# Patient Record
Sex: Male | Born: 1950 | Race: White | Hispanic: No | Marital: Married | Smoking: Never smoker
Health system: Southern US, Community
[De-identification: ages and names within clinical notes are randomized; demographics above are authoritative.]

## PROBLEM LIST (undated history)

## (undated) DIAGNOSIS — I10 Essential (primary) hypertension: Secondary | ICD-10-CM

## (undated) DIAGNOSIS — E119 Type 2 diabetes mellitus without complications: Secondary | ICD-10-CM

## (undated) DIAGNOSIS — E785 Hyperlipidemia, unspecified: Secondary | ICD-10-CM

## (undated) HISTORY — DX: Hyperlipidemia, unspecified: E78.5

## (undated) HISTORY — DX: Essential (primary) hypertension: I10

## (undated) HISTORY — PX: CORONARY ANGIOPLASTY: SHX604

## (undated) HISTORY — DX: Type 2 diabetes mellitus without complications: E11.9

---

## 2015-07-19 DIAGNOSIS — I6782 Cerebral ischemia: Secondary | ICD-10-CM

## 2015-07-19 DIAGNOSIS — I469 Cardiac arrest, cause unspecified: Secondary | ICD-10-CM | POA: Insufficient documentation

## 2015-07-19 DIAGNOSIS — I213 ST elevation (STEMI) myocardial infarction of unspecified site: Secondary | ICD-10-CM | POA: Insufficient documentation

## 2015-07-19 DIAGNOSIS — G931 Anoxic brain damage, not elsewhere classified: Secondary | ICD-10-CM | POA: Insufficient documentation

## 2015-07-23 DIAGNOSIS — J189 Pneumonia, unspecified organism: Secondary | ICD-10-CM | POA: Insufficient documentation

## 2015-07-23 DIAGNOSIS — I10 Essential (primary) hypertension: Secondary | ICD-10-CM | POA: Insufficient documentation

## 2015-07-23 DIAGNOSIS — E118 Type 2 diabetes mellitus with unspecified complications: Secondary | ICD-10-CM | POA: Insufficient documentation

## 2015-07-23 DIAGNOSIS — N179 Acute kidney failure, unspecified: Secondary | ICD-10-CM | POA: Insufficient documentation

## 2015-07-23 DIAGNOSIS — I5020 Unspecified systolic (congestive) heart failure: Secondary | ICD-10-CM | POA: Insufficient documentation

## 2015-07-24 DIAGNOSIS — R7881 Bacteremia: Secondary | ICD-10-CM | POA: Insufficient documentation

## 2015-07-26 DIAGNOSIS — I639 Cerebral infarction, unspecified: Secondary | ICD-10-CM | POA: Insufficient documentation

## 2015-07-27 DIAGNOSIS — I6322 Cerebral infarction due to unspecified occlusion or stenosis of basilar arteries: Secondary | ICD-10-CM

## 2015-07-27 DIAGNOSIS — I63212 Cerebral infarction due to unspecified occlusion or stenosis of left vertebral arteries: Secondary | ICD-10-CM | POA: Insufficient documentation

## 2015-07-27 DIAGNOSIS — I63442 Cerebral infarction due to embolism of left cerebellar artery: Secondary | ICD-10-CM | POA: Insufficient documentation

## 2015-08-19 DIAGNOSIS — I255 Ischemic cardiomyopathy: Secondary | ICD-10-CM | POA: Insufficient documentation

## 2015-08-19 DIAGNOSIS — I2510 Atherosclerotic heart disease of native coronary artery without angina pectoris: Secondary | ICD-10-CM | POA: Insufficient documentation

## 2015-08-19 DIAGNOSIS — I6990 Unspecified sequelae of unspecified cerebrovascular disease: Secondary | ICD-10-CM | POA: Insufficient documentation

## 2016-07-26 ENCOUNTER — Telehealth: Payer: Self-pay | Admitting: Cardiology

## 2016-07-26 ENCOUNTER — Other Ambulatory Visit: Payer: Self-pay

## 2016-07-26 DIAGNOSIS — I213 ST elevation (STEMI) myocardial infarction of unspecified site: Secondary | ICD-10-CM | POA: Insufficient documentation

## 2016-07-26 DIAGNOSIS — I2102 ST elevation (STEMI) myocardial infarction involving left anterior descending coronary artery: Secondary | ICD-10-CM | POA: Insufficient documentation

## 2016-07-26 MED ORDER — FUROSEMIDE 20 MG PO TABS: 20.0000 mg | ORAL_TABLET | Freq: Every day | ORAL | 3 refills | Status: DC

## 2016-07-26 NOTE — Telephone Encounter (Signed)
Refills sent to pharmacy. 

## 2016-07-26 NOTE — Telephone Encounter (Signed)
Call [facility]urosimide 20mg  [facility] [place]

## 2016-08-04 ENCOUNTER — Telehealth: Payer: Self-pay

## 2016-08-04 ENCOUNTER — Encounter: Payer: Self-pay | Admitting: Cardiology

## 2016-08-04 ENCOUNTER — Ambulatory Visit (INDEPENDENT_AMBULATORY_CARE_PROVIDER_SITE_OTHER): Payer: Medicare HMO | Admitting: Cardiology

## 2016-08-04 VITALS — BP 140/84 | HR 76 | Resp 12 | Ht 71.0 in | Wt 283.8 lb

## 2016-08-04 DIAGNOSIS — I10 Essential (primary) hypertension: Secondary | ICD-10-CM | POA: Diagnosis not present

## 2016-08-04 DIAGNOSIS — I255 Ischemic cardiomyopathy: Secondary | ICD-10-CM | POA: Diagnosis not present

## 2016-08-04 DIAGNOSIS — I6990 Unspecified sequelae of unspecified cerebrovascular disease: Secondary | ICD-10-CM | POA: Diagnosis not present

## 2016-08-04 DIAGNOSIS — I2510 Atherosclerotic heart disease of native coronary artery without angina pectoris: Secondary | ICD-10-CM

## 2016-08-04 NOTE — Telephone Encounter (Signed)
Attempt to reach patient to inform of appt for Lexiscan at [facility] 8/1 and 8/2 at Adam Carney code [phone] order faxed to [email or web address]

## 2016-08-04 NOTE — Telephone Encounter (Signed)
Attempted to call patient's number in chart. No answer, no voicemail. Called son listed on DPR. Appointment dates and times given to him and he will inform the patient.

## 2016-08-04 NOTE — Patient Instructions (Signed)
Medication Instructions:  Your physician recommends that you continue on your current medications as directed. Please refer to the Current Medication list given to you today.   Labwork: None  Testing/Procedures: Your physician has requested that you have a lexiscan myoview. For further information please visit [email or web address]. Please follow instruction sheet, as given.    Follow-Up: Your physician recommends that you schedule a follow-up appointment in: 1 month   Any Other Special Instructions Will Be Listed Below (If Applicable).     If you need a refill on your cardiac medications before your next appointment, please call your pharmacy.

## 2016-08-04 NOTE — Progress Notes (Signed)
Cardiology Office Note:    Date:  08/04/2016   ID:  Adam Carney, DOB 19-Jun-1950, MRN [id number]  PCP:  Martinique, Sarah T, MD  Cardiologist:  Jenne Campus, MD    Referring MD: Martinique, Sarah T, MD   Chief Complaint  Patient presents with  . Follow-up  I'm weak and tired  History of Present Illness:    Adam Carney is a 66 y.o. male  with coronary artery disease, he complained of the week.exhausted. He tries to exercise a little bit more aggressively but still tiredness and fatigue is better. On top of that his wife described the fact that he is getting very pale sometimes when he exercises. Denies having any chest pain tightness squeezing pressure burning chest but even when he had his myocardial infarction he did not have any symptoms.  Past Medical History:  Diagnosis Date  . Diabetes mellitus without complication ([place])   . Hyperlipidemia   . Hypertension     Past Surgical History:  Procedure Laterality Date  . CORONARY ANGIOPLASTY      Current Medications: Current Meds  Medication Sig  . apixaban (ELIQUIS) 5 MG TABS tablet Take 5 mg by mouth 2 (two) times daily.  Marland Kitchen atorvastatin (LIPITOR) 80 MG tablet Take 80 mg by mouth daily.  . carvedilol (COREG) 25 MG tablet Take 25 mg by mouth 2 (two) times daily.  . furosemide (LASIX) 20 MG tablet Take 1 tablet (20 mg total) by mouth daily.  Marland Kitchen glipiZIDE (GLUCOTROL) 5 MG tablet Take 5 mg by mouth 2 (two) times daily before a meal.   . Insulin Detemir (LEVEMIR FLEXPEN) 100 UNIT/ML Pen Inject 60 Units into the skin daily.   Marland Kitchen liraglutide (VICTOZA) 18 MG/3ML SOPN TAKE 0.6 MG (0.6ML) SUBQ DAILY FOR 1 WEEK, THEN INCREASE TO 1.2MG ( 1.2ML) SUB Q WEEKLY.  Marland Kitchen losartan (COZAAR) 50 MG tablet Take 50 mg by mouth.  Marland Kitchen omeprazole (PRILOSEC) 20 MG capsule Take 20 mg by mouth.  . [DISCONTINUED] furosemide (LASIX) 20 MG tablet Take 40 mg by mouth daily.  . [DISCONTINUED] ticagrelor (BRILINTA) 90 MG TABS tablet Take 90 mg by mouth 2 (two) times daily.     Allergies:   Patient has no known allergies.   Social History   Social History  . Marital status: Married    Spouse name: N/A  . Number of children: N/A  . Years of education: N/A   Social History Main Topics  . Smoking status: Never Smoker  . Smokeless tobacco: Never Used  . Alcohol use No  . Drug use: No  . Sexual activity: Not Asked   Other Topics Concern  . None   Social History Narrative  . None     Family History: The patient's family history includes AAA (abdominal aortic aneurysm) in his father; Diabetes in his mother; Hyperlipidemia in his mother; Hypertension in his mother. ROS:   Please see the history of present illness.    All 14 point review of systems negative except as described per history of present illness  EKGs/Labs/Other Studies Reviewed:      Recent Labs: No results found for requested labs within last 8760 hours.  Recent Lipid Panel No results found for: CHOL, TRIG, HDL, CHOLHDL, VLDL, LDLCALC, LDLDIRECT  Physical Exam:    VS:  BP 140/84   Pulse 76   Resp 12   Ht 5\' 11"  (1.803 m)   Wt 283 lb 12.8 oz (128.7 kg)   BMI 39.58 kg/m     Wt  Readings from Last 3 Encounters:  08/04/16 283 lb 12.8 oz (128.7 kg)     GEN:  Well nourished, well developed in no acute distress HEENT: Normal NECK: No JVD; No carotid bruits LYMPHATICS: No lymphadenopathy CARDIAC: RRR, no murmurs, no rubs, no gallops RESPIRATORY:  Clear to auscultation without rales, wheezing or rhonchi  ABDOMEN: Soft, non-tender, non-distended MUSCULOSKELETAL:  No edema; No deformity  SKIN: Warm and dry LOWER EXTREMITIES: no swelling NEUROLOGIC:  Alert and oriented x 3 PSYCHIATRIC:  Normal affect   ASSESSMENT:    1. Essential hypertension   2. Ischemic cardiomyopathy   3. Late effects of CVA (cerebrovascular accident)   4. Coronary artery disease involving native coronary artery of native heart without angina pectoris    PLAN:    In order of problems listed  above:  1. Essential hypertension: Blood pressure mildly elevated we'll call his primary care physician. Chem-7 C5 increased dose of losartan. 2. Ischemic heart myopathy: We'll do stress test which we'll give him some estimation of ejection fraction. In December we'll repeat echocardiogram. 3. Late effect of CVA: No new changes. 4. Coronary artery disease: He describes some atypical symptoms I will ask him to have a stress test done. She already finished one year course of Brilinta.   Medication Adjustments/Labs and Tests Ordered: Current medicines are reviewed at length with the patient today.  Concerns regarding medicines are outlined above.  No orders of the defined types were placed in this encounter.  Medication changes: No orders of the defined types were placed in this encounter.   Signed, Park Liter, MD, [facility] 08/04/2016 9:33 AM    [place]

## 2016-08-09 DIAGNOSIS — I2510 Atherosclerotic heart disease of native coronary artery without angina pectoris: Secondary | ICD-10-CM | POA: Diagnosis not present

## 2016-08-11 ENCOUNTER — Other Ambulatory Visit: Payer: Self-pay

## 2016-08-11 DIAGNOSIS — I5043 Acute on chronic combined systolic (congestive) and diastolic (congestive) heart failure: Secondary | ICD-10-CM

## 2016-08-11 DIAGNOSIS — I255 Ischemic cardiomyopathy: Secondary | ICD-10-CM

## 2016-08-11 MED ORDER — FUROSEMIDE 20 MG PO TABS: 20.0000 mg | ORAL_TABLET | Freq: Three times a day (TID) | ORAL | 3 refills | Status: DC

## 2016-08-11 MED ORDER — LOSARTAN POTASSIUM 50 MG PO TABS: 50.0000 mg | ORAL_TABLET | Freq: Every day | ORAL | 3 refills | Status: DC

## 2016-08-11 MED ORDER — ATORVASTATIN CALCIUM 80 MG PO TABS: 80.0000 mg | ORAL_TABLET | Freq: Every day | ORAL | 3 refills | Status: DC

## 2016-08-11 MED ORDER — CARVEDILOL 25 MG PO TABS: 25.0000 mg | ORAL_TABLET | Freq: Two times a day (BID) | ORAL | 3 refills | Status: DC

## 2016-08-11 MED ORDER — APIXABAN 5 MG PO TABS: 5.0000 mg | ORAL_TABLET | Freq: Two times a day (BID) | ORAL | 11 refills | Status: DC

## 2016-08-14 ENCOUNTER — Telehealth: Payer: Self-pay

## 2016-08-14 NOTE — Telephone Encounter (Signed)
Wife advised of date and time of the echo.

## 2016-08-14 NOTE — Telephone Encounter (Signed)
Please call wife about echo appt .cn

## 2016-08-15 ENCOUNTER — Other Ambulatory Visit: Payer: Self-pay

## 2016-08-15 MED ORDER — APIXABAN 5 MG PO TABS: 5.0000 mg | ORAL_TABLET | Freq: Two times a day (BID) | ORAL | 11 refills | Status: DC

## 2016-08-25 ENCOUNTER — Ambulatory Visit (HOSPITAL_BASED_OUTPATIENT_CLINIC_OR_DEPARTMENT_OTHER)
Admission: RE | Admit: 2016-08-25 | Discharge: 2016-08-25 | Disposition: A | Discharge status: Home / Self Care | Payer: Medicare HMO | Source: Ambulatory Visit | Attending: Cardiology | Admitting: Cardiology

## 2016-08-25 DIAGNOSIS — I255 Ischemic cardiomyopathy: Secondary | ICD-10-CM

## 2016-08-25 DIAGNOSIS — I051 Rheumatic mitral insufficiency: Secondary | ICD-10-CM | POA: Diagnosis not present

## 2016-08-25 NOTE — Progress Notes (Signed)
  Echocardiogram 2D Echocardiogram has been performed.  Adam Carney Adam Carney 08/25/2016, 10:11 AM

## 2016-09-01 ENCOUNTER — Ambulatory Visit (INDEPENDENT_AMBULATORY_CARE_PROVIDER_SITE_OTHER): Payer: Medicare HMO | Admitting: Cardiology

## 2016-09-01 VITALS — BP 114/70 | HR 68 | Resp 10 | Ht 71.0 in | Wt 284.8 lb

## 2016-09-01 DIAGNOSIS — I255 Ischemic cardiomyopathy: Secondary | ICD-10-CM

## 2016-09-01 DIAGNOSIS — I10 Essential (primary) hypertension: Secondary | ICD-10-CM | POA: Diagnosis not present

## 2016-09-01 DIAGNOSIS — I6990 Unspecified sequelae of unspecified cerebrovascular disease: Secondary | ICD-10-CM

## 2016-09-01 DIAGNOSIS — I5022 Chronic systolic (congestive) heart failure: Secondary | ICD-10-CM | POA: Diagnosis not present

## 2016-09-01 NOTE — Progress Notes (Signed)
Cardiology Office Note:    Date:  09/01/2016   ID:  Adam Carney, DOB Jan 06, 1951, MRN [id number]  PCP:  Martinique, Sarah T, MD  Cardiologist:  Jenne Campus, MD    Referring MD: Martinique, Sarah T, MD   Chief Complaint  Patient presents with  . 1 month follow up  I'm doing better  History of Present Illness:    Adam Carney is a 66 y.o. male  with ischemic cardiomyopathy is being complaining of being tired and exhausted having exertional shortness of breath. Stress test was done which showed no evidence of ischemia: Echocardiac exam was done which showed ejection fraction 40-45%. We spent cradle time talking about the scenario and I realized that he does not do anything all day. I strongly advised him to start walking every single day. Interestingly when he went to have his echocardiogram done in [place] just walking from the car to the cardiovascular department created such a pain in his legs that Today he does have some soreness of his muscles which basically tells me that he does not do much. Again, advised him to exercise on the radial basis he most likely have sleep apnea he was talking to his primary care physician about potentially being tested for it but since he is claustrophobic he said he is not on his equipment that's when he is not interested in being tested. I told him however if he started exercising he will lose some weight which will help with potential sleep apnea.  Past Medical History:  Diagnosis Date  . Diabetes mellitus without complication ([place])   . Hyperlipidemia   . Hypertension     Past Surgical History:  Procedure Laterality Date  . CORONARY ANGIOPLASTY      Current Medications: Current Meds  Medication Sig  . apixaban (ELIQUIS) 5 MG TABS tablet Take 1 tablet (5 mg total) by mouth 2 (two) times daily.  Marland Kitchen atorvastatin (LIPITOR) 80 MG tablet Take 1 tablet (80 mg total) by mouth daily.  . carvedilol (COREG) 25 MG tablet Take 1 tablet (25 mg total) by mouth 2  (two) times daily.  . furosemide (LASIX) 20 MG tablet Take 1 tablet (20 mg total) by mouth 3 (three) times daily.  Marland Kitchen glipiZIDE (GLUCOTROL) 5 MG tablet Take 5 mg by mouth 2 (two) times daily before a meal.   . Insulin Detemir (LEVEMIR FLEXPEN) 100 UNIT/ML Pen Inject 60 Units into the skin daily.   Marland Kitchen liraglutide (VICTOZA) 18 MG/3ML SOPN TAKE 0.6 MG (0.6ML) SUBQ DAILY FOR 1 WEEK, THEN INCREASE TO 1.2MG ( 1.2ML) SUB Q WEEKLY.  Marland Kitchen losartan (COZAAR) 50 MG tablet Take 1 tablet (50 mg total) by mouth daily.  Marland Kitchen omeprazole (PRILOSEC) 20 MG capsule Take 20 mg by mouth.     Allergies:   Patient has no known allergies.   Social History   Social History  . Marital status: Married    Spouse name: N/A  . Number of children: N/A  . Years of education: N/A   Social History Main Topics  . Smoking status: Never Smoker  . Smokeless tobacco: Never Used  . Alcohol use No  . Drug use: No  . Sexual activity: Not on file   Other Topics Concern  . Not on file   Social History Narrative  . No narrative on file     Family History: The patient's family history includes AAA (abdominal aortic aneurysm) in his father; Diabetes in his mother; Hyperlipidemia in his mother; Hypertension in his mother. ROS:  Please see the history of present illness.    All 14 point review of systems negative except as described per history of present illness  EKGs/Labs/Other Studies Reviewed:      Recent Labs: No results found for requested labs within last 8760 hours.  Recent Lipid Panel No results found for: CHOL, TRIG, HDL, CHOLHDL, VLDL, LDLCALC, LDLDIRECT  Physical Exam:    VS:  BP 114/70   Pulse 68   Resp 10   Ht 5\' 11"  (1.803 m)   Wt 284 lb 12.8 oz (129.2 kg)   BMI 39.72 kg/m     Wt Readings from Last 3 Encounters:  09/01/16 284 lb 12.8 oz (129.2 kg)  08/04/16 283 lb 12.8 oz (128.7 kg)     GEN:  Well nourished, well developed in no acute distress HEENT: Normal NECK: No JVD; No carotid  bruits LYMPHATICS: No lymphadenopathy CARDIAC: RRR, no murmurs, no rubs, no gallops RESPIRATORY:  Clear to auscultation without rales, wheezing or rhonchi  ABDOMEN: Soft, non-tender, non-distended MUSCULOSKELETAL:  No edema; No deformity  SKIN: Warm and dry LOWER EXTREMITIES: no swelling NEUROLOGIC:  Alert and oriented x 3 PSYCHIATRIC:  Normal affect   ASSESSMENT:    1. Essential hypertension   2. Ischemic cardiomyopathy   3. Chronic systolic congestive heart failure ([place])   4. Late effects of CVA (cerebrovascular accident)    PLAN:    In order of problems listed above:  1. Essential hypertension: Blood pressure well-controlled continue present management. 2. Ischemic coronary myopathy on appropriate medications which I will continue. 3. Chronic systolic congestive-stable continue present management. 4. Late effect of CVA stable.  I will see him back in my office in about 3 months assuming his problem   Medication Adjustments/Labs and Tests Ordered: Current medicines are reviewed at length with the patient today.  Concerns regarding medicines are outlined above.  No orders of the defined types were placed in this encounter.  Medication changes: No orders of the defined types were placed in this encounter.   Signed, Park Liter, MD, [facility] 09/01/2016 9:03 AM    [place]

## 2016-09-01 NOTE — Patient Instructions (Addendum)
Medication Instructions:  Your physician recommends that you continue on your current medications as directed. Please refer to the Current Medication list given to you today.   Labwork: None  Testing/Procedures: None  Follow-Up: Your physician recommends that you schedule a follow-up appointment in: 3 months  Any Other Special Instructions Will Be Listed Below (If Applicable).     If you need a refill on your cardiac medications before your next appointment, please call your pharmacy.   

## 2016-09-13 ENCOUNTER — Telehealth: Payer: Self-pay

## 2016-09-13 NOTE — Telephone Encounter (Signed)
S/w pts wife and advised that I s/w [employer] rep regarding the status of the patient assistance form and they noted they are behind from April. I have double checked and corrected any problems with the application to ensure the process can be done as quick as possible per the rep.

## 2016-11-27 ENCOUNTER — Telehealth: Payer: Self-pay | Admitting: Cardiology

## 2016-11-27 ENCOUNTER — Other Ambulatory Visit: Payer: Self-pay

## 2016-11-27 MED ORDER — APIXABAN 5 MG PO TABS: 5.0000 mg | ORAL_TABLET | Freq: Two times a day (BID) | ORAL | 3 refills | Status: DC

## 2016-11-27 NOTE — Telephone Encounter (Signed)
Please call patient about her application for Eliquis and they still have not heard or received any medicine. Please call her.

## 2016-11-27 NOTE — Telephone Encounter (Signed)
S/w pt assistance with Eliquis. They are still stating they are missing the proper documents after this has been addressed multiple times in October. A new RX was printed and forms were resent. The representee on the phone states they had no alternative fax number to send request. Wife was notified of this as well and I have arranged a sooner f/u for the pt so that we can provide samples at his planned follow up.

## 2016-12-05 ENCOUNTER — Ambulatory Visit: Payer: Medicare HMO | Admitting: Cardiology

## 2016-12-05 ENCOUNTER — Encounter: Payer: Self-pay | Admitting: Cardiology

## 2016-12-05 VITALS — BP 108/64 | HR 64 | Resp 12 | Ht 71.0 in | Wt 288.8 lb

## 2016-12-05 DIAGNOSIS — I5022 Chronic systolic (congestive) heart failure: Secondary | ICD-10-CM

## 2016-12-05 DIAGNOSIS — I255 Ischemic cardiomyopathy: Secondary | ICD-10-CM

## 2016-12-05 DIAGNOSIS — I2510 Atherosclerotic heart disease of native coronary artery without angina pectoris: Secondary | ICD-10-CM

## 2016-12-05 DIAGNOSIS — I10 Essential (primary) hypertension: Secondary | ICD-10-CM

## 2016-12-05 NOTE — Patient Instructions (Addendum)
Medication Instructions:  Your physician recommends that you continue on your current medications as directed. Please refer to the Current Medication list given to you today.  Labwork: None   Testing/Procedures: Your physician has requested that you have an ankle brachial index (ABI). During this test an ultrasound and blood pressure cuff are used to evaluate the arteries that supply the arms and legs with blood. Allow thirty minutes for this exam. There are no restrictions or special instructions.   Follow-Up: Your physician recommends that you schedule a follow-up appointment in: 4 months   Any Other Special Instructions Will Be Listed Below (If Applicable).  Please note that any paperwork needing to be filled out by the provider will need to be addressed at the front desk prior to seeing the provider. Please note that any paperwork FMLA, Disability or other documents regarding health condition is subject to a $25.00 charge that must be received prior to completion of paperwork in the form of a money order or check.    If you need a refill on your cardiac medications before your next appointment, please call your pharmacy.

## 2016-12-05 NOTE — Progress Notes (Signed)
Cardiology Office Note:    Date:  12/05/2016   ID:  Adam Carney, DOB [birth year], MRN [id number]  PCP:  Martinique, Sarah T, MD  Cardiologist:  Adam Campus, MD    Referring MD: Martinique, Sarah T, MD   Chief Complaint  Patient presents with  . Follow-up  Doing well  History of Present Illness:    Adam Carney is a 66 y.o. male with cardiomyopathy which is ischemic in origin ejection fraction 45%.  Also diabetes and hypertension.  He comes today to our office for follow-up.  Overall he is doing well.  Denies having any chest pain tightness squeezing pressure burning chest.  I was trying to convince him to be a little more active and he is trying to do that.  Still complained of having leg pain that happens especially when he walks.  Initial thinking was that he simply deconditioned but now since he walk a little bit more still develop pain in his thighs as well as in his calves.  Suggesting of vascular disease.  Past Medical History:  Diagnosis Date  . Diabetes mellitus without complication ([place])   . Hyperlipidemia   . Hypertension     Past Surgical History:  Procedure Laterality Date  . CORONARY ANGIOPLASTY      Current Medications: Current Meds  Medication Sig  . apixaban (ELIQUIS) 5 MG TABS tablet Take 1 tablet (5 mg total) 2 (two) times daily by mouth.  Marland Kitchen atorvastatin (LIPITOR) 80 MG tablet Take 1 tablet (80 mg total) by mouth daily.  . carvedilol (COREG) 25 MG tablet Take 1 tablet (25 mg total) by mouth 2 (two) times daily.  . furosemide (LASIX) 20 MG tablet Take 1 tablet (20 mg total) by mouth 3 (three) times daily.  Marland Kitchen glipiZIDE (GLUCOTROL) 5 MG tablet Take 5 mg by mouth 2 (two) times daily before a meal.   . Insulin Detemir (LEVEMIR FLEXPEN) 100 UNIT/ML Pen Inject 60 Units into the skin daily.   Marland Kitchen liraglutide (VICTOZA) 18 MG/3ML SOPN TAKE 0.6 MG (0.6ML) SUBQ DAILY FOR 1 WEEK, THEN INCREASE TO 1.2MG ( 1.2ML) SUB Q WEEKLY.  Marland Kitchen losartan (COZAAR) 50 MG tablet Take 1 tablet (50 mg  total) by mouth daily.  Marland Kitchen omeprazole (PRILOSEC) 20 MG capsule Take 20 mg by mouth.     Allergies:   Patient has no known allergies.   Social History   Socioeconomic History  . Marital status: Married    Spouse name: None  . Number of children: None  . Years of education: None  . Highest education level: None  Social Needs  . Financial resource strain: None  . Food insecurity - worry: None  . Food insecurity - inability: None  . Transportation needs - medical: None  . Transportation needs - non-medical: None  Occupational History  . None  Tobacco Use  . Smoking status: Never Smoker  . Smokeless tobacco: Never Used  Substance and Sexual Activity  . Alcohol use: No  . Drug use: No  . Sexual activity: None  Other Topics Concern  . None  Social History Narrative  . None     Family History: The patient's family history includes AAA (abdominal aortic aneurysm) in his father; Diabetes in his mother; Hyperlipidemia in his mother; Hypertension in his mother. ROS:   Please see the history of present illness.    All 14 point review of systems negative except as described per history of present illness  EKGs/Labs/Other Studies Reviewed:      Recent  Labs: No results found for requested labs within last 8760 hours.  Recent Lipid Panel No results found for: CHOL, TRIG, HDL, CHOLHDL, VLDL, LDLCALC, LDLDIRECT  Physical Exam:    VS:  BP 108/64   Pulse 64   Resp 12   Ht 5\' 11"  (1.803 m)   Wt 288 lb 12.8 oz (131 kg)   BMI 40.28 kg/m     Wt Readings from Last 3 Encounters:  12/05/16 288 lb 12.8 oz (131 kg)  09/01/16 284 lb 12.8 oz (129.2 kg)  08/04/16 283 lb 12.8 oz (128.7 kg)     GEN:  Well nourished, well developed in no acute distress HEENT: Normal NECK: No JVD; No carotid bruits LYMPHATICS: No lymphadenopathy CARDIAC: RRR, no murmurs, no rubs, no gallops RESPIRATORY:  Clear to auscultation without rales, wheezing or rhonchi  ABDOMEN: Soft, non-tender,  non-distended MUSCULOSKELETAL:  No edema; No deformity  SKIN: Warm and dry LOWER EXTREMITIES: no swelling NEUROLOGIC:  Alert and oriented x 3 PSYCHIATRIC:  Normal affect   ASSESSMENT:    1. Coronary artery disease involving native coronary artery of native heart without angina pectoris   2. Essential hypertension   3. Ischemic cardiomyopathy   4. Chronic systolic congestive heart failure (H[facility])    PLAN:    In order of problems listed above:  1. Coronary artery disease: Stable and appropriate medication no recent events we will continue present management. 2. Essential hypertension: Well-controlled will continue present management. 3. Ischemic cardiomyopathy: On ARB as well as beta-blocker which I will continue.  He is stable and compensated.  We will continue present management. 4. Chronic systolic congestive heart failure: Compensated.  Will call primary care physician to get his fasting lipid profile as well as Chem-7.  Pain in the lower extremities while walking suspicious for claudications.  I will ask him to have segmental pressures done.  If we will not be able to do that at least ABIs. Diabetes mellitus: Apparently, controlled fairly well I will call primary care physician to get hemoglobin A1c.   Medication Adjustments/Labs and Tests Ordered: Current medicines are reviewed at length with the patient today.  Concerns regarding medicines are outlined above.  No orders of the defined types were placed in this encounter.  Medication changes: No orders of the defined types were placed in this encounter.   Signed, Park Liter, MD, [facility] 12/05/2016 8:34 AM    [place]

## 2016-12-08 ENCOUNTER — Ambulatory Visit: Payer: Medicare HMO | Admitting: Cardiology

## 2016-12-26 ENCOUNTER — Ambulatory Visit (HOSPITAL_BASED_OUTPATIENT_CLINIC_OR_DEPARTMENT_OTHER)
Admission: RE | Admit: 2016-12-26 | Discharge: 2016-12-26 | Disposition: A | Discharge status: Home / Self Care | Payer: Medicare HMO | Source: Ambulatory Visit | Attending: Cardiology | Admitting: Cardiology

## 2016-12-26 DIAGNOSIS — I10 Essential (primary) hypertension: Secondary | ICD-10-CM | POA: Diagnosis not present

## 2016-12-26 DIAGNOSIS — I255 Ischemic cardiomyopathy: Secondary | ICD-10-CM | POA: Diagnosis not present

## 2016-12-26 DIAGNOSIS — I5022 Chronic systolic (congestive) heart failure: Secondary | ICD-10-CM | POA: Diagnosis not present

## 2016-12-26 DIAGNOSIS — I2510 Atherosclerotic heart disease of native coronary artery without angina pectoris: Secondary | ICD-10-CM | POA: Diagnosis not present

## 2016-12-26 NOTE — Progress Notes (Signed)
ABI completed.  

## 2017-03-26 ENCOUNTER — Telehealth: Payer: Self-pay | Admitting: *Deleted

## 2017-03-26 MED ORDER — OMEPRAZOLE 20 MG PO CPDR: 20.0000 mg | DELAYED_RELEASE_CAPSULE | Freq: Every day | ORAL | 2 refills | Status: DC

## 2017-03-26 NOTE — Telephone Encounter (Signed)
Refill sent.

## 2017-03-26 NOTE — Telephone Encounter (Signed)
[facility] for ref[facility] [place]

## 2017-06-06 ENCOUNTER — Encounter: Payer: Self-pay | Admitting: Cardiology

## 2017-06-06 ENCOUNTER — Ambulatory Visit: Payer: Medicare HMO | Admitting: Cardiology

## 2017-06-06 VITALS — BP 132/80 | HR 62 | Ht 71.0 in | Wt 286.4 lb

## 2017-06-06 DIAGNOSIS — I10 Essential (primary) hypertension: Secondary | ICD-10-CM

## 2017-06-06 DIAGNOSIS — E785 Hyperlipidemia, unspecified: Secondary | ICD-10-CM | POA: Diagnosis not present

## 2017-06-06 DIAGNOSIS — I6990 Unspecified sequelae of unspecified cerebrovascular disease: Secondary | ICD-10-CM | POA: Diagnosis not present

## 2017-06-06 DIAGNOSIS — I255 Ischemic cardiomyopathy: Secondary | ICD-10-CM

## 2017-06-06 DIAGNOSIS — I2510 Atherosclerotic heart disease of native coronary artery without angina pectoris: Secondary | ICD-10-CM | POA: Diagnosis not present

## 2017-06-06 DIAGNOSIS — E118 Type 2 diabetes mellitus with unspecified complications: Secondary | ICD-10-CM | POA: Diagnosis not present

## 2017-06-06 DIAGNOSIS — Z794 Long term (current) use of insulin: Secondary | ICD-10-CM | POA: Diagnosis not present

## 2017-06-06 NOTE — Patient Instructions (Signed)
Medication Instructions:  Your physician recommends that you continue on your current medications as directed. Please refer to the Current Medication list given to you today.   Labwork: Your physician recommends that you return for lab work today: lipid panel.   Testing/Procedures: None  Follow-Up: Your physician wants you to follow-up in: 3 months. You will receive a reminder letter in the mail two months in advance. If you don't receive a letter, please call our office to schedule the follow-up appointment.   If you need a refill on your cardiac medications before your next appointment, please call your pharmacy.   Thank you for choosing [facility]! Robyne Peers, RN [phone]

## 2017-06-06 NOTE — Progress Notes (Signed)
Cardiology Office Note:    Date:  06/06/2017   ID:  Adam Carney, DOB 07/15/1950, MRN [id number]  PCP:  Martinique, Sarah T, MD  Cardiologist:  Jenne Campus, MD    Referring MD: Martinique, Sarah T, MD   Chief Complaint  Patient presents with  . Follow-up  Doing fair  History of Present Illness:    Adam Carney is a 67 y.o. male with coronary artery disease diabetes which is difficult to control, history of CVA overall cardiac wise doing well denies have any chest pain tightness squeezing pressure burning chest.  He can walk with some difficulties because of chronic back problem.  Apparently she he required some steroids injection however it cannot be done right now since he does have still poorly controlled diabetes.  He is seeing endocrinologist and there is some progress being made but still some room to work. Denies have any chest pain tightness squeezing pressure burning chest.  No shortness of breath with a regular exercise.  Past Medical History:  Diagnosis Date  . Diabetes mellitus without complication ([place])   . Hyperlipidemia   . Hypertension     Past Surgical History:  Procedure Laterality Date  . CORONARY ANGIOPLASTY      Current Medications: Current Meds  Medication Sig  . apixaban (ELIQUIS) 5 MG TABS tablet Take 1 tablet (5 mg total) 2 (two) times daily by mouth.  Marland Kitchen atorvastatin (LIPITOR) 80 MG tablet Take 1 tablet (80 mg total) by mouth daily.  . carvedilol (COREG) 25 MG tablet Take 1 tablet (25 mg total) by mouth 2 (two) times daily.  . cyclobenzaprine (FLEXERIL) 10 MG tablet Take 1 tablet by mouth as needed.  . furosemide (LASIX) 20 MG tablet Take 1 tablet (20 mg total) by mouth 3 (three) times daily.  . insulin aspart (NOVOLOG) 100 UNIT/ML FlexPen Use as directed. Max daily dose is 150 units  . Insulin Detemir (LEVEMIR FLEXPEN) 100 UNIT/ML Pen Inject 60 Units into the skin daily.   Marland Kitchen liraglutide (VICTOZA) 18 MG/3ML SOPN TAKE 0.6 MG (0.6ML) SUBQ DAILY FOR 1 WEEK,  THEN INCREASE TO 1.2MG ( 1.2ML) SUB Q WEEKLY.  Marland Kitchen losartan (COZAAR) 50 MG tablet Take 1 tablet (50 mg total) by mouth daily.  Marland Kitchen omeprazole (PRILOSEC) 20 MG capsule Take 1 capsule (20 mg total) by mouth daily.     Allergies:   Patient has no known allergies.   Social History   Socioeconomic History  . Marital status: Married    Spouse name: Not on file  . Number of children: Not on file  . Years of education: Not on file  . Highest education level: Not on file  Occupational History  . Not on file  Social Needs  . Financial resource strain: Not on file  . Food insecurity:    Worry: Not on file    Inability: Not on file  . Transportation needs:    Medical: Not on file    Non-medical: Not on file  Tobacco Use  . Smoking status: Never Smoker  . Smokeless tobacco: Never Used  Substance and Sexual Activity  . Alcohol use: No  . Drug use: No  . Sexual activity: Not on file  Lifestyle  . Physical activity:    Days per week: Not on file    Minutes per session: Not on file  . Stress: Not on file  Relationships  . Social connections:    Talks on phone: Not on file    Gets together: Not on file  Attends religious service: Not on file    Active member of club or organization: Not on file    Attends meetings of clubs or organizations: Not on file    Relationship status: Not on file  Other Topics Concern  . Not on file  Social History Narrative  . Not on file     Family History: The patient's family history includes AAA (abdominal aortic aneurysm) in his father; Diabetes in his mother; Hyperlipidemia in his mother; Hypertension in his mother. ROS:   Please see the history of present illness.    All 14 point review of systems negative except as described per history of present illness  EKGs/Labs/Other Studies Reviewed:      Recent Labs: No results found for requested labs within last 8760 hours.  Recent Lipid Panel No results found for: CHOL, TRIG, HDL, CHOLHDL, VLDL,  LDLCALC, LDLDIRECT  Physical Exam:    VS:  BP 132/80   Pulse 62   Ht 5\' 11"  (1.803 m)   Wt 286 lb 6.4 oz (129.9 kg)   SpO2 98%   BMI 39.94 kg/m     Wt Readings from Last 3 Encounters:  06/06/17 286 lb 6.4 oz (129.9 kg)  12/05/16 288 lb 12.8 oz (131 kg)  09/01/16 284 lb 12.8 oz (129.2 kg)     GEN:  Well nourished, well developed in no acute distress HEENT: Normal NECK: No JVD; No carotid bruits LYMPHATICS: No lymphadenopathy CARDIAC: RRR, no murmurs, no rubs, no gallops RESPIRATORY:  Clear to auscultation without rales, wheezing or rhonchi  ABDOMEN: Soft, non-tender, non-distended MUSCULOSKELETAL:  No edema; No deformity  SKIN: Warm and dry LOWER EXTREMITIES: no swelling NEUROLOGIC:  Alert and oriented x 3 PSYCHIATRIC:  Normal affect   ASSESSMENT:    1. Coronary artery disease involving native coronary artery of native heart without angina pectoris   2. Ischemic cardiomyopathy   3. Essential hypertension   4. Late effects of CVA (cerebrovascular accident)   5. Type 2 diabetes mellitus with complication, with long-term current use of insulin (HCC)    PLAN:    In order of problems listed above:  1. Coronary artery disease: Stable we will continue present management. 2. Cardiomyopathy which is ischemic in origin with latest ejection fraction 45% he is on appropriate medications which I will continue. 3. Essential hypertension well-controlled continue present management. 4. Type 2 diabetes working with endocrinologist trying to control his diabetes.  Difficult management but there is some progress being made. 5. Dyslipidemia: We will check his fasting lipid profile today.  Him back in 3 months sooner if he get a problem   Medication Adjustments/Labs and Tests Ordered: Current medicines are reviewed at length with the patient today.  Concerns regarding medicines are outlined above.  No orders of the defined types were placed in this encounter.  Medication changes: No  orders of the defined types were placed in this encounter.   Signed, Park Liter, MD, [facility] 06/06/2017 10:10 AM    [place]

## 2017-06-07 LAB — LIPID PANEL
CHOLESTEROL TOTAL: 134 mg/dL (ref 100–199)
Chol/HDL Ratio: 4.6 ratio (ref 0.0–5.0)
HDL: 29 mg/dL — AB (ref >39)
LDL CALC: 81 mg/dL (ref 0–99)
Triglycerides: 121 mg/dL (ref 0–149)
VLDL CHOLESTEROL CAL: 24 mg/dL (ref 5–40)

## 2017-06-12 ENCOUNTER — Telehealth: Payer: Self-pay | Admitting: *Deleted

## 2017-06-12 DIAGNOSIS — E785 Hyperlipidemia, unspecified: Secondary | ICD-10-CM

## 2017-06-12 MED ORDER — EZETIMIBE 10 MG PO TABS: 10.0000 mg | ORAL_TABLET | Freq: Every day | ORAL | 3 refills | Status: DC

## 2017-06-12 NOTE — Telephone Encounter (Signed)
Patient informed of results. Advised patient to start zetia 10 mg daily and repeat lab work at [facility] in [place] the week of 07/24/17. Reminded patient to be fasting for follow up lab work. [facility] [place] [facility]. Patient verbalized understanding. No further questions.

## 2017-06-12 NOTE — Addendum Note (Signed)
Addended by: Austin Miles on: 06/12/2017 01:53 PM   Modules accepted: Orders

## 2017-06-12 NOTE — Telephone Encounter (Signed)
-----   Message from Park Liter, MD sent at 06/07/2017  2:47 PM EDT ----- Cholesterol still not perfect his LDL is 81 need to be less than 70 I decided to his medical regiment fasting lipid profile in 6 weeks

## 2017-06-21 ENCOUNTER — Telehealth: Payer: Self-pay | Admitting: Cardiology

## 2017-06-21 NOTE — Telephone Encounter (Signed)
Advised to stop taking Zetia at this time. No other symptoms, just seems like he is losing control of his bowels.  Please advise if he can take something else. Dr. Wendy Poet patient.

## 2017-06-21 NOTE — Telephone Encounter (Signed)
Started on ezetimibe and has has uncontrollable diarrhea, is there something else he can take?

## 2017-07-13 ENCOUNTER — Telehealth: Payer: Self-pay | Admitting: *Deleted

## 2017-07-13 NOTE — Telephone Encounter (Signed)
Informed patient and patient's wife that he has been approved for patient assistance for eliquis 5 mg twice daily. They are both aware and have already received the medication in the mail. No further questions.

## 2017-08-15 ENCOUNTER — Other Ambulatory Visit: Payer: Self-pay | Admitting: Cardiology

## 2017-08-15 MED ORDER — FUROSEMIDE 20 MG PO TABS: 20.0000 mg | ORAL_TABLET | Freq: Three times a day (TID) | ORAL | 3 refills | Status: DC

## 2017-08-15 NOTE — Telephone Encounter (Signed)
Bradley Bostelman [phone]  [facility] [place]  furosemide (LASIX) 20 MG tablet  [place] called with concerns about Naveed;s prescription for furosemide (LASIX) 20 MG tablet She said he is will run out this weekend and that the pharmacy said the prescription had been denied for refills, she is concerned about her husband not taking this mediation with his medical history. Will you please call and advise.

## 2017-08-15 NOTE — Telephone Encounter (Signed)
Called and advised that patient's wife that his refill has been sent in.

## 2017-09-04 ENCOUNTER — Telehealth: Payer: Self-pay | Admitting: Cardiology

## 2017-09-04 ENCOUNTER — Other Ambulatory Visit: Payer: Self-pay

## 2017-09-04 MED ORDER — ATORVASTATIN CALCIUM 80 MG PO TABS: 80.0000 mg | ORAL_TABLET | Freq: Every day | ORAL | 0 refills | Status: DC

## 2017-09-04 NOTE — Telephone Encounter (Signed)
Med refill has been sent. 

## 2017-09-04 NOTE — Telephone Encounter (Signed)
[facility]atin [facility] [place]

## 2017-09-19 ENCOUNTER — Telehealth: Payer: Self-pay | Admitting: *Deleted

## 2017-09-19 NOTE — Telephone Encounter (Signed)
Spoke to patient and patient's wife about this. They are going to contact company for refills and will let the office know if they have any problems.

## 2017-09-19 NOTE — Telephone Encounter (Signed)
Time to refill Eliquis which pt gets at Dr. Agustin Cree. Company will mail meds to [place] and pt will come pick up at office. Pt gets assistance with this medication. If have any questions can call pt back.

## 2017-09-27 ENCOUNTER — Other Ambulatory Visit: Payer: Self-pay | Admitting: Cardiology

## 2017-11-22 ENCOUNTER — Telehealth: Payer: Self-pay | Admitting: Emergency Medicine

## 2017-11-22 NOTE — Telephone Encounter (Signed)
Called to inform patient that it is time to renew patient assistance for eliquis. He states he will bring the forms to his next office visit on 11/28/17.

## 2017-11-28 ENCOUNTER — Ambulatory Visit: Payer: Medicare HMO | Admitting: Cardiology

## 2017-11-28 ENCOUNTER — Encounter: Payer: Self-pay | Admitting: Cardiology

## 2017-11-28 ENCOUNTER — Encounter (INDEPENDENT_AMBULATORY_CARE_PROVIDER_SITE_OTHER): Payer: Self-pay

## 2017-11-28 VITALS — BP 142/70 | HR 84 | Ht 71.0 in | Wt 287.2 lb

## 2017-11-28 DIAGNOSIS — I5022 Chronic systolic (congestive) heart failure: Secondary | ICD-10-CM

## 2017-11-28 DIAGNOSIS — I10 Essential (primary) hypertension: Secondary | ICD-10-CM | POA: Diagnosis not present

## 2017-11-28 DIAGNOSIS — I6990 Unspecified sequelae of unspecified cerebrovascular disease: Secondary | ICD-10-CM

## 2017-11-28 DIAGNOSIS — I2510 Atherosclerotic heart disease of native coronary artery without angina pectoris: Secondary | ICD-10-CM

## 2017-11-28 NOTE — Patient Instructions (Signed)
Medication Instructions:  Your physician recommends that you continue on your current medications as directed. Please refer to the Current Medication list given to you today.  If you need a refill on your cardiac medications before your next appointment, please call your pharmacy.   Lab work: None ordered If you have labs (blood work) drawn today and your tests are completely normal, you will receive your results only by: Marland Kitchen MyChart Message (if you have MyChart) OR . A paper copy in the mail If you have any lab test that is abnormal or we need to change your treatment, we will call you to review the results.  Testing/Procedures: None ordered  Follow-Up: At [facility], you and your health needs are our priority.  As part of our continuing mission to provide you with exceptional heart care, we have created designated Provider Care Teams.  These Care Teams include your primary Cardiologist (physician) and Advanced Practice Providers (APPs -  Physician Assistants and Nurse Practitioners) who all work together to provide you with the care you need, when you need it. You will need a follow up appointment in 3 months.  Please call our office 2 months in advance to schedule this appointment.  You may see Jenne Campus or another member of our [employer] Provider Team in [place]: Shirlee More, MD . Jyl Heinz, MD  Any Other Special Instructions Will Be Listed Below (If Applicable).

## 2017-11-28 NOTE — Progress Notes (Signed)
Cardiology Office Note:    Date:  11/28/2017   ID:  Adam Carney, DOB 09-10-1950, MRN [id number]  PCP:  Martinique, Sarah T, MD  Cardiologist:  Jenne Campus, MD    Referring MD: Martinique, Sarah T, MD   Chief Complaint  Patient presents with  . Follow-up  Doing well  History of Present Illness:    Adam Carney is a 67 y.o. male with coronary artery disease status post cardiac arrest.  Ejection fraction 45 to 50%.  Doing well overall denies have any chest pain tightness squeezing pressure burning chest he brought to concerns today first is the fact that he does have a rectal dysfunction and he wanted my opinion about potentially using medication to help with this I told him from my point review that should be fine another issue that they brought to the fact that he would like to go back to drive.  I told him the best way to assess this will be to do psychomotor testing and I recommended to have this test done before letting him drive.  Past Medical History:  Diagnosis Date  . Diabetes mellitus without complication ([place])   . Hyperlipidemia   . Hypertension     Past Surgical History:  Procedure Laterality Date  . CORONARY ANGIOPLASTY      Current Medications: Current Meds  Medication Sig  . apixaban (ELIQUIS) 5 MG TABS tablet Take 1 tablet (5 mg total) 2 (two) times daily by mouth.  Marland Kitchen atorvastatin (LIPITOR) 80 MG tablet TAKE 1 TABLET BY MOUTH EVERY DAY  . carvedilol (COREG) 25 MG tablet Take 1 tablet (25 mg total) by mouth 2 (two) times daily.  . cyclobenzaprine (FLEXERIL) 10 MG tablet Take 1 tablet by mouth as needed.  . furosemide (LASIX) 20 MG tablet Take 1 tablet (20 mg total) by mouth 3 (three) times daily.  . insulin aspart (NOVOLOG) 100 UNIT/ML FlexPen Use as directed. Max daily dose is 150 units  . liraglutide (VICTOZA) 18 MG/3ML SOPN TAKE 0.6 MG (0.6ML) SUBQ DAILY FOR 1 WEEK, THEN INCREASE TO 1.2MG ( 1.2ML) SUB Q WEEKLY.  Marland Kitchen losartan (COZAAR) 50 MG tablet Take 1 tablet (50 mg  total) by mouth daily.  Marland Kitchen omeprazole (PRILOSEC) 20 MG capsule Take 1 capsule (20 mg total) by mouth daily.  . TRESIBA FLEXTOUCH 100 UNIT/ML SOPN FlexTouch Pen 50 units in the morning and 60 units at night     Allergies:   Zetia [ezetimibe]   Social History   Socioeconomic History  . Marital status: Married    Spouse name: Not on file  . Number of children: Not on file  . Years of education: Not on file  . Highest education level: Not on file  Occupational History  . Not on file  Social Needs  . Financial resource strain: Not on file  . Food insecurity:    Worry: Not on file    Inability: Not on file  . Transportation needs:    Medical: Not on file    Non-medical: Not on file  Tobacco Use  . Smoking status: Never Smoker  . Smokeless tobacco: Never Used  Substance and Sexual Activity  . Alcohol use: No  . Drug use: No  . Sexual activity: Not on file  Lifestyle  . Physical activity:    Days per week: Not on file    Minutes per session: Not on file  . Stress: Not on file  Relationships  . Social connections:    Talks on phone: Not  on file    Gets together: Not on file    Attends religious service: Not on file    Active member of club or organization: Not on file    Attends meetings of clubs or organizations: Not on file    Relationship status: Not on file  Other Topics Concern  . Not on file  Social History Narrative  . Not on file     Family History: The patient's family history includes AAA (abdominal aortic aneurysm) in his father; Diabetes in his mother; Hyperlipidemia in his mother; Hypertension in his mother. ROS:   Please see the history of present illness.    All 14 point review of systems negative except as described per history of present illness  EKGs/Labs/Other Studies Reviewed:      Recent Labs: No results found for requested labs within last 8760 hours.  Recent Lipid Panel    Component Value Date/Time   CHOL 134 06/06/2017 1040   TRIG 121  06/06/2017 1040   HDL 29 (L) 06/06/2017 1040   CHOLHDL 4.6 06/06/2017 1040   LDLCALC 81 06/06/2017 1040    Physical Exam:    VS:  BP (!) 142/70   Pulse 84   Ht 5\' 11"  (1.803 m)   Wt 287 lb 3.2 oz (130.3 kg)   SpO2 94%   BMI 40.06 kg/m     Wt Readings from Last 3 Encounters:  11/28/17 287 lb 3.2 oz (130.3 kg)  06/06/17 286 lb 6.4 oz (129.9 kg)  12/05/16 288 lb 12.8 oz (131 kg)     GEN:  Well nourished, well developed in no acute distress HEENT: Normal NECK: No JVD; No carotid bruits LYMPHATICS: No lymphadenopathy CARDIAC: RRR, no murmurs, no rubs, no gallops RESPIRATORY:  Clear to auscultation without rales, wheezing or rhonchi  ABDOMEN: Soft, non-tender, non-distended MUSCULOSKELETAL:  No edema; No deformity  SKIN: Warm and dry LOWER EXTREMITIES: no swelling NEUROLOGIC:  Alert and oriented x 3 PSYCHIATRIC:  Normal affect   ASSESSMENT:    1. Coronary artery disease involving native coronary artery of native heart without angina pectoris   2. Essential hypertension   3. Chronic systolic congestive heart failure ([place])   4. Late effects of CVA (cerebrovascular accident)    PLAN:    In order of problems listed above:  1. Coronary artery disease stable from that point review on appropriate medications which I will continue. 2. Essential hypertension blood pressure well controlled we will continue present management. 3. Systolic congestive heart failure compensated he is on Coreg which I will continue also on Cozaar which I will continue.  Last echocardiogram showed ejection fraction 45 to 50% which was done in the summer. 4. Late effect of CVA stable.   Medication Adjustments/Labs and Tests Ordered: Current medicines are reviewed at length with the patient today.  Concerns regarding medicines are outlined above.  No orders of the defined types were placed in this encounter.  Medication changes: No orders of the defined types were placed in this  encounter.   Signed, Park Liter, MD, [facility] 11/28/2017 10:12 AM    [place]

## 2017-12-20 ENCOUNTER — Telehealth: Payer: Self-pay | Admitting: Emergency Medicine

## 2017-12-20 NOTE — Telephone Encounter (Signed)
Called [place] patient assistance. They informed me that the quantity needs to be updated. I have updated this and will fax back in. They also will need out of pocket expense form from patient pharmacy after the beginning of the year, informed patient. He plans to bring to me to fax after acquiring expenses.

## 2018-01-05 ENCOUNTER — Other Ambulatory Visit: Payer: Self-pay | Admitting: Cardiology

## 2018-03-04 ENCOUNTER — Ambulatory Visit: Payer: Medicare HMO | Admitting: Cardiology

## 2018-03-27 ENCOUNTER — Telehealth: Payer: Self-pay | Admitting: Cardiology

## 2018-03-27 ENCOUNTER — Ambulatory Visit: Payer: Medicare HMO | Admitting: Cardiology

## 2018-03-27 MED ORDER — CARVEDILOL 25 MG PO TABS: 25.0000 mg | ORAL_TABLET | Freq: Two times a day (BID) | ORAL | 1 refills | Status: DC

## 2018-03-27 NOTE — Telephone Encounter (Signed)
Refills sent in

## 2018-03-27 NOTE — Telephone Encounter (Signed)
[facility] [place]

## 2018-04-12 ENCOUNTER — Telehealth: Payer: Self-pay | Admitting: Cardiology

## 2018-04-16 NOTE — Telephone Encounter (Signed)
done

## 2018-04-24 ENCOUNTER — Other Ambulatory Visit: Payer: Self-pay | Admitting: Cardiology

## 2018-06-10 ENCOUNTER — Telehealth: Payer: Self-pay | Admitting: Cardiology

## 2018-06-10 NOTE — Telephone Encounter (Signed)
Virtual Visit Pre-Appointment Phone Call  "(Name), I am calling you today to discuss your upcoming appointment. We are currently trying to limit exposure to the virus that causes COVID-19 by seeing patients at home rather than in the office."  1. "What is the BEST phone number to call the day of the visit?" - include this in appointment notes  2. Do you have or have access to (through a family member/friend) a smartphone with video capability that we can use for your visit?" a. If yes - list this number in appt notes as cell (if different from BEST phone #) and list the appointment type as a VIDEO visit in appointment notes b. If no - list the appointment type as a PHONE visit in appointment notes  3. Confirm consent - "In the setting of the current Covid19 crisis, you are scheduled for a (phone or video) visit with your provider on (date) at (time).  Just as we do with many in-office visits, in order for you to participate in this visit, we must obtain consent.  If you'd like, I can send this to your mychart (if signed up) or email for you to review.  Otherwise, I can obtain your verbal consent now.  All virtual visits are billed to your insurance company just like a normal visit would be.  By agreeing to a virtual visit, we'd like you to understand that the technology does not allow for your provider to perform an examination, and thus may limit your provider's ability to fully assess your condition. If your provider identifies any concerns that need to be evaluated in person, we will make arrangements to do so.  Finally, though the technology is pretty good, we cannot assure that it will always work on either your or our end, and in the setting of a video visit, we may have to convert it to a phone-only visit.  In either situation, we cannot ensure that we have a secure connection.  Are you willing to proceed?" STAFF: Did the patient verbally acknowledge consent to telehealth visit? Document  YES/NO here: YES  4. Advise patient to be prepared - "Two hours prior to your appointment, go ahead and check your blood pressure, pulse, oxygen saturation, and your weight (if you have the equipment to check those) and write them all down. When your visit starts, your provider will ask you for this information. If you have an Apple Watch or Kardia device, please plan to have heart rate information ready on the day of your appointment. Please have a pen and paper handy nearby the day of the visit as well."  5. Give patient instructions for MyChart download to smartphone OR Doximity/Doxy.me as below if video visit (depending on what platform provider is using)  6. Inform patient they will receive a phone call 15 minutes prior to their appointment time (may be from unknown caller ID) so they should be prepared to answer    TELEPHONE CALL NOTE  Adam Carney has been deemed a candidate for a follow-up tele-health visit to limit community exposure during the Covid-19 pandemic. I spoke with the patient via phone to ensure availability of phone/video source, confirm preferred email & phone number, and discuss instructions and expectations.  I reminded Adam Carney to be prepared with any vital sign and/or heart rhythm information that could potentially be obtained via home monitoring, at the time of his visit. I reminded Adam Carney to expect a phone call prior to his visit.  Adam Carney 06/10/2018 10:59 AM   INSTRUCTIONS FOR DOWNLOADING THE MYCHART APP TO SMARTPHONE  - The patient must first make sure to have activated MyChart and know their login information - If Apple, go to [employer] and type in MyChart in the search bar and download the app. If Android, ask patient to go to [employer] and type in Corinne in the search bar and download the app. The app is free but as with any other app downloads, their phone may require them to verify saved payment information or Apple/Android password.    - The patient will need to then log into the app with their MyChart username and password, and select [facility] as their healthcare provider to link the account. When it is time for your visit, go to the MyChart app, find appointments, and click Begin Video Visit. Be sure to Select Allow for your device to access the Microphone and Camera for your visit. You will then be connected, and your provider will be with you shortly.  **If they have any issues connecting, or need assistance please contact MyChart service desk [phone])**  **If using a computer, in order to ensure the best quality for their visit they will need to use either of the following Internet Browsers: [employer], or Google Chrome**  IF USING DOXIMITY or DOXY.ME - The patient will receive a link just prior to their visit by text.     FULL LENGTH CONSENT FOR TELE-HEALTH VISIT   I hereby voluntarily request, consent and authorize [facility] and its employed or contracted physicians, physician assistants, nurse practitioners or other licensed health care professionals (the Practitioner), to provide me with telemedicine health care services (the Services") as deemed necessary by the treating Practitioner. I acknowledge and consent to receive the Services by the Practitioner via telemedicine. I understand that the telemedicine visit will involve communicating with the Practitioner through live audiovisual communication technology and the disclosure of certain medical information by electronic transmission. I acknowledge that I have been given the opportunity to request an in-person assessment or other available alternative prior to the telemedicine visit and am voluntarily participating in the telemedicine visit.  I understand that I have the right to withhold or withdraw my consent to the use of telemedicine in the course of my care at any time, without affecting my right to future care or treatment, and that  the Practitioner or I may terminate the telemedicine visit at any time. I understand that I have the right to inspect all information obtained and/or recorded in the course of the telemedicine visit and may receive copies of available information for a reasonable fee.  I understand that some of the potential risks of receiving the Services via telemedicine include:   Delay or interruption in medical evaluation due to technological equipment failure or disruption;  Information transmitted may not be sufficient (e.g. poor resolution of images) to allow for appropriate medical decision making by the Practitioner; and/or   In rare instances, security protocols could fail, causing a breach of personal health information.  Furthermore, I acknowledge that it is my responsibility to provide information about my medical history, conditions and care that is complete and accurate to the best of my ability. I acknowledge that Practitioner's advice, recommendations, and/or decision may be based on factors not within their control, such as incomplete or inaccurate data provided by me or distortions of diagnostic images or specimens that may result from electronic transmissions. I understand that  the practice of medicine is not an exact science and that Practitioner makes no warranties or guarantees regarding treatment outcomes. I acknowledge that I will receive a copy of this consent concurrently upon execution via email to the email address I last provided but may also request a printed copy by calling the office of [facility].    I understand that my insurance will be billed for this visit.   I have read or had this consent read to me.  I understand the contents of this consent, which adequately explains the benefits and risks of the Services being provided via telemedicine.   I have been provided ample opportunity to ask questions regarding this consent and the Services and have had my questions answered to  my satisfaction.  I give my informed consent for the services to be provided through the use of telemedicine in my medical care  By participating in this telemedicine visit I agree to the above.

## 2018-06-19 ENCOUNTER — Other Ambulatory Visit: Payer: Self-pay | Admitting: Cardiology

## 2018-06-27 ENCOUNTER — Other Ambulatory Visit: Payer: Self-pay

## 2018-06-27 ENCOUNTER — Encounter: Payer: Self-pay | Admitting: Cardiology

## 2018-06-27 ENCOUNTER — Telehealth (INDEPENDENT_AMBULATORY_CARE_PROVIDER_SITE_OTHER): Payer: Medicare HMO | Admitting: Cardiology

## 2018-06-27 VITALS — BP 140/70

## 2018-06-27 DIAGNOSIS — I10 Essential (primary) hypertension: Secondary | ICD-10-CM

## 2018-06-27 DIAGNOSIS — I2510 Atherosclerotic heart disease of native coronary artery without angina pectoris: Secondary | ICD-10-CM | POA: Diagnosis not present

## 2018-06-27 DIAGNOSIS — I255 Ischemic cardiomyopathy: Secondary | ICD-10-CM

## 2018-06-27 DIAGNOSIS — I6990 Unspecified sequelae of unspecified cerebrovascular disease: Secondary | ICD-10-CM

## 2018-06-27 NOTE — Patient Instructions (Signed)
Medication Instructions:  Your physician recommends that you continue on your current medications as directed. Please refer to the Current Medication list given to you today.  If you need a refill on your cardiac medications before your next appointment, please call your pharmacy.   Lab work: Your physician recommends that you return for lab work fasting in 1 week: lipids, ast, alt   If you have labs (blood work) drawn today and your tests are completely normal, you will receive your results only by: Marland Kitchen MyChart Message (if you have MyChart) OR . A paper copy in the mail If you have any lab test that is abnormal or we need to change your treatment, we will call you to review the results.  Testing/Procedures: Your physician has requested that you have an echocardiogram. Echocardiography is a painless test that uses sound waves to create images of your heart. It provides your doctor with information about the size and shape of your heart and how well your heart's chambers and valves are working. This procedure takes approximately one hour. There are no restrictions for this procedure.    Follow-Up: At [facility], you and your health needs are our priority.  As part of our continuing mission to provide you with exceptional heart care, we have created designated Provider Care Teams.  These Care Teams include your primary Cardiologist (physician) and Advanced Practice Providers (APPs -  Physician Assistants and Nurse Practitioners) who all work together to provide you with the care you need, when you need it. You will need a follow up appointment in 4 months.  Please call our office 2 months in advance to schedule this appointment.  You may see No primary care provider on file. or another member of our [employer] Provider Team in [place]: Shirlee More, MD . Jyl Heinz, MD  Any Other Special Instructions Will Be Listed Below (If Applicable).   Echocardiogram An echocardiogram is a  procedure that uses painless sound waves (ultrasound) to produce an image of the heart. Images from an echocardiogram can provide important information about:  Signs of coronary artery disease (CAD).  Aneurysm detection. An aneurysm is a weak or damaged part of an artery wall that bulges out from the normal force of blood pumping through the body.  Heart size and shape. Changes in the size or shape of the heart can be associated with certain conditions, including heart failure, aneurysm, and CAD.  Heart muscle function.  Heart valve function.  Signs of a past heart attack.  Fluid buildup around the heart.  Thickening of the heart muscle.  A tumor or infectious growth around the heart valves. Tell a health care provider about:  Any allergies you have.  All medicines you are taking, including vitamins, herbs, eye drops, creams, and over-the-counter medicines.  Any blood disorders you have.  Any surgeries you have had.  Any medical conditions you have.  Whether you are pregnant or may be pregnant. What are the risks? Generally, this is a safe procedure. However, problems may occur, including:  Allergic reaction to dye (contrast) that may be used during the procedure. What happens before the procedure? No specific preparation is needed. You may eat and drink normally. What happens during the procedure?   An IV tube may be inserted into one of your veins.  You may receive contrast through this tube. A contrast is an injection that improves the quality of the pictures from your heart.  A gel will be applied to your chest.  A wand-like tool (transducer) will be moved over your chest. The gel will help to transmit the sound waves from the transducer.  The sound waves will harmlessly bounce off of your heart to allow the heart images to be captured in real-time motion. The images will be recorded on a computer. The procedure may vary among health care providers and hospitals.  What happens after the procedure?  You may return to your normal, everyday life, including diet, activities, and medicines, unless your health care provider tells you not to do that. Summary  An echocardiogram is a procedure that uses painless sound waves (ultrasound) to produce an image of the heart.  Images from an echocardiogram can provide important information about the size and shape of your heart, heart muscle function, heart valve function, and fluid buildup around your heart.  You do not need to do anything to prepare before this procedure. You may eat and drink normally.  After the echocardiogram is completed, you may return to your normal, everyday life, unless your health care provider tells you not to do that. This information is not intended to replace advice given to you by your health care provider. Make sure you discuss any questions you have with your health care provider. Document Released: 12/24/1999 Document Revised: 01/29/2016 Document Reviewed: 01/29/2016 Elsevier Interactive Patient Education  2019 [employer].

## 2018-06-27 NOTE — Addendum Note (Signed)
Addended by: Ashok Norris on: 06/27/2018 08:46 AM   Modules accepted: Orders

## 2018-06-27 NOTE — Progress Notes (Signed)
Virtual Visit via Telephone Note   This visit type was conducted due to national recommendations for restrictions regarding the COVID-19 Pandemic (e.g. social distancing) in an effort to limit this patient's exposure and mitigate transmission in our community.  Due to his co-morbid illnesses, this patient is at least at moderate risk for complications without adequate follow up.  This format is felt to be most appropriate for this patient at this time.  The patient did not have access to video technology/had technical difficulties with video requiring transitioning to audio format only (telephone).  All issues noted in this document were discussed and addressed.  No physical exam could be performed with this format.  Please refer to the patient's chart for his  consent to telehealth for [facility].  Evaluation Performed:  Follow-up visit  This visit type was conducted due to national recommendations for restrictions regarding the COVID-19 Pandemic (e.g. social distancing).  This format is felt to be most appropriate for this patient at this time.  All issues noted in this document were discussed and addressed.  No physical exam was performed (except for noted visual exam findings with Video Visits).  Please refer to the patient's chart (MyChart message for video visits and phone note for telephone visits) for the patient's consent to telehealth for [facility].  Date:  06/27/2018  ID: Adam Carney, DOB Apr 13, 1950, MRN [id number]   Patient Location: [address] [place] [address]   Provider location:   [place] Office  PCP:  [place], Sarah T, MD  Cardiologist:  Jenne Campus, MD     Chief Complaint: Doing well  History of Present Illness:    Adam Carney is a 68 y.o. male  who presents via audio/video conferencing for a telehealth visit today.  Complex past medical history.  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  Required prolonged resuscitation including multiple shocks eventually recovered.  Stent to proximal LAD was inserted.  Also while in the hospital suffered from CVA and since that time he is being anticoagulated since there was a substrate for potential LV thrombus on echocardiogram.  Also hypertension diabetes dyslipidemia overall seems to be doing well he said he walks a little he does take care of 15 treatments that he got.  Denies having any chest pain tightness squeezing pressure burning chest overall seems to be doing well.   The patient does not have symptoms concerning for COVID-19 infection (fever, chills, cough, or new SHORTNESS OF BREATH).    Prior CV studies:   The following studies were reviewed today:       Past Medical History:  Diagnosis Date  . Diabetes mellitus without complication ([place])   . Hyperlipidemia   . Hypertension     Past Surgical History:  Procedure Laterality Date  . CORONARY ANGIOPLASTY       Current Meds  Medication Sig  . apixaban (ELIQUIS) 5 MG TABS tablet Take 1 tablet (5 mg total) 2 (two) times daily by mouth.  Marland Kitchen atorvastatin (LIPITOR) 80 MG tablet TAKE 1 TABLET BY MOUTH EVERY DAY  . carvedilol (COREG) 25 MG tablet TAKE 1 TABLET BY MOUTH TWICE A DAY  . cyclobenzaprine (FLEXERIL) 10 MG tablet Take 1 tablet by mouth as needed.  . furosemide (LASIX) 20 MG tablet Take 1 tablet (20 mg total) by mouth 3 (three) times daily.  . insulin aspart (NOVOLOG) 100 UNIT/ML FlexPen Use as directed. Max daily dose  is 150 units  . liraglutide (VICTOZA) 18 MG/3ML SOPN TAKE 0.6 MG (0.6ML) SUBQ DAILY FOR 1 WEEK, THEN INCREASE TO 1.2MG ( 1.2ML) SUB Q WEEKLY.  Marland Kitchen losartan (COZAAR) 50 MG tablet Take 1 tablet (50 mg total) by mouth daily.  Marland Kitchen omeprazole (PRILOSEC) 20 MG capsule TAKE 1 CAPSULE BY MOUTH EVERY DAY  . TRESIBA FLEXTOUCH 100 UNIT/ML SOPN FlexTouch Pen 50 units in the morning and 60 units at night      Family History: The patient's family  history includes AAA (abdominal aortic aneurysm) in his father; Diabetes in his mother; Hyperlipidemia in his mother; Hypertension in his mother.   ROS:   Please see the history of present illness.     All other systems reviewed and are negative.   Labs/Other Tests and Data Reviewed:     Recent Labs: No results found for requested labs within last 8760 hours.  Recent Lipid Panel    Component Value Date/Time   CHOL 134 06/06/2017 1040   TRIG 121 06/06/2017 1040   HDL 29 (L) 06/06/2017 1040   CHOLHDL 4.6 06/06/2017 1040   LDLCALC 81 06/06/2017 1040      Exam:    Vital Signs:  BP 140/70     Wt Readings from Last 3 Encounters:  11/28/17 287 lb 3.2 oz (130.3 kg)  06/06/17 286 lb 6.4 oz (129.9 kg)  12/05/16 288 lb 12.8 oz (131 kg)     Well nourished, well developed in no acute distress. Alert oriented x3 happy to be able to talk to me over the phone.  Denies having any problem at the moment of my interview except for the fact that he does have a lot of cough coughing spells happening in the morning.  And he thinks it may be related to some of the medication.  He read about losartan and thinks losartan does not.  Diagnosis for this visit:   1. Coronary artery disease involving native coronary artery of native heart without angina pectoris   2. Ischemic cardiomyopathy   3. Essential hypertension   4. Late effects of CVA (cerebrovascular accident)      ASSESSMENT & PLAN:    1.  Coronary artery disease status post PTCA and drug-eluting stent to proximal LAD in the face of acute myocardial infarction 2017.  Overall he seems to be doing well denies having any chest pain, tightness, squeezing, pressure, burning in her chest is able to do activities of daily living without limitations.  All medication review and appropriate.  He does have a cough and he thinks related to losartan I told him to stop losartan for 1 week and see if he feels any better. 2.  Ischemic cardiomyopathy: I  will repeat echocardiogram last echocardiogram showed ejection fraction 40 to 45%.  He is taking losartan, did not tolerate beta-blocker previously.  But this is something that will be reviewed after echocardiogram will be done.  Previously also he got quite significant segmental wall motion for abnormalities and decision has been made to continue with anticoagulation since he does have a substrate for LV thrombus.  Will decide about continuation of this medication after echocardiogram will be done. 3.  Essential hypertension blood pressure controlled continue present management. 4.  Dyslipidemia we will check his fasting lipid profile in the meantime we will continue with high intensity statin in form of Lipitor. 5.  Cough he wants to stop losartan for a week I do not mind however I more suspect this is postnasal drip rather  than losartan.  I told him if stopping losartan will not improve the situation him to go back on it and then he can try some Flonase.  COVID-19 Education: The signs and symptoms of COVID-19 were discussed with the patient and how to seek care for testing (follow up with PCP or arrange E-visit).  The importance of social distancing was discussed today.  Patient Risk:   After full review of this patients clinical status, I feel that they are at least moderate risk at this time.  Time:   Today, I have spent 20 minutes with the patient with telehealth technology discussing pt health issues.  I spent 5 minutes reviewing her chart before the visit.  Visit was finished at 8:23 AM.    Medication Adjustments/Labs and Tests Ordered: Current medicines are reviewed at length with the patient today.  Concerns regarding medicines are outlined above.  No orders of the defined types were placed in this encounter.  Medication changes: No orders of the defined types were placed in this encounter.    Disposition:  4 months    Signed, Park Liter, MD, [facility] 06/27/2018 8:21 AM     [place]

## 2018-07-04 LAB — LIPID PANEL
Chol/HDL Ratio: 3.5 ratio (ref 0.0–5.0)
Cholesterol, Total: 120 mg/dL (ref 100–199)
HDL: 34 mg/dL — ABNORMAL LOW (ref >39)
LDL Calculated: 70 mg/dL (ref 0–99)
Triglycerides: 79 mg/dL (ref 0–149)
VLDL Cholesterol Cal: 16 mg/dL (ref 5–40)

## 2018-07-04 LAB — AST: AST: 28 IU/L (ref 0–40)

## 2018-07-04 LAB — ALT: ALT: 24 IU/L (ref 0–44)

## 2018-07-05 ENCOUNTER — Inpatient Hospital Stay (HOSPITAL_COMMUNITY): Payer: Medicare HMO

## 2018-07-05 ENCOUNTER — Inpatient Hospital Stay (HOSPITAL_COMMUNITY)
Admission: AD | Admit: 2018-07-05 | Discharge: 2018-07-10 | DRG: 291 | Disposition: A | Discharge status: Home / Self Care | Payer: Medicare HMO | Source: Other Acute Inpatient Hospital | Attending: Internal Medicine | Admitting: Internal Medicine

## 2018-07-05 DIAGNOSIS — Z794 Long term (current) use of insulin: Secondary | ICD-10-CM | POA: Diagnosis not present

## 2018-07-05 DIAGNOSIS — E1122 Type 2 diabetes mellitus with diabetic chronic kidney disease: Secondary | ICD-10-CM | POA: Diagnosis not present

## 2018-07-05 DIAGNOSIS — Z8674 Personal history of sudden cardiac arrest: Secondary | ICD-10-CM

## 2018-07-05 DIAGNOSIS — E118 Type 2 diabetes mellitus with unspecified complications: Secondary | ICD-10-CM | POA: Diagnosis not present

## 2018-07-05 DIAGNOSIS — E1151 Type 2 diabetes mellitus with diabetic peripheral angiopathy without gangrene: Secondary | ICD-10-CM | POA: Diagnosis present

## 2018-07-05 DIAGNOSIS — I2489 Other forms of acute ischemic heart disease: Secondary | ICD-10-CM | POA: Diagnosis present

## 2018-07-05 DIAGNOSIS — Z6841 Body Mass Index (BMI) 40.0 and over, adult: Secondary | ICD-10-CM | POA: Diagnosis not present

## 2018-07-05 DIAGNOSIS — Z833 Family history of diabetes mellitus: Secondary | ICD-10-CM

## 2018-07-05 DIAGNOSIS — F0150 Vascular dementia without behavioral disturbance: Secondary | ICD-10-CM | POA: Diagnosis present

## 2018-07-05 DIAGNOSIS — G931 Anoxic brain damage, not elsewhere classified: Secondary | ICD-10-CM | POA: Diagnosis present

## 2018-07-05 DIAGNOSIS — I5023 Acute on chronic systolic (congestive) heart failure: Secondary | ICD-10-CM | POA: Diagnosis not present

## 2018-07-05 DIAGNOSIS — I2510 Atherosclerotic heart disease of native coronary artery without angina pectoris: Secondary | ICD-10-CM | POA: Diagnosis present

## 2018-07-05 DIAGNOSIS — N184 Chronic kidney disease, stage 4 (severe): Secondary | ICD-10-CM | POA: Diagnosis present

## 2018-07-05 DIAGNOSIS — I5021 Acute systolic (congestive) heart failure: Secondary | ICD-10-CM

## 2018-07-05 DIAGNOSIS — I69398 Other sequelae of cerebral infarction: Secondary | ICD-10-CM

## 2018-07-05 DIAGNOSIS — E1165 Type 2 diabetes mellitus with hyperglycemia: Secondary | ICD-10-CM | POA: Diagnosis present

## 2018-07-05 DIAGNOSIS — I701 Atherosclerosis of renal artery: Secondary | ICD-10-CM | POA: Diagnosis present

## 2018-07-05 DIAGNOSIS — N179 Acute kidney failure, unspecified: Secondary | ICD-10-CM | POA: Diagnosis present

## 2018-07-05 DIAGNOSIS — Z20828 Contact with and (suspected) exposure to other viral communicable diseases: Secondary | ICD-10-CM | POA: Diagnosis present

## 2018-07-05 DIAGNOSIS — I248 Other forms of acute ischemic heart disease: Secondary | ICD-10-CM | POA: Diagnosis present

## 2018-07-05 DIAGNOSIS — Z8349 Family history of other endocrine, nutritional and metabolic diseases: Secondary | ICD-10-CM

## 2018-07-05 DIAGNOSIS — E039 Hypothyroidism, unspecified: Secondary | ICD-10-CM | POA: Diagnosis present

## 2018-07-05 DIAGNOSIS — Z79899 Other long term (current) drug therapy: Secondary | ICD-10-CM

## 2018-07-05 DIAGNOSIS — R7989 Other specified abnormal findings of blood chemistry: Secondary | ICD-10-CM | POA: Diagnosis present

## 2018-07-05 DIAGNOSIS — I509 Heart failure, unspecified: Secondary | ICD-10-CM

## 2018-07-05 DIAGNOSIS — I5043 Acute on chronic combined systolic (congestive) and diastolic (congestive) heart failure: Secondary | ICD-10-CM | POA: Diagnosis present

## 2018-07-05 DIAGNOSIS — R778 Other specified abnormalities of plasma proteins: Secondary | ICD-10-CM | POA: Diagnosis present

## 2018-07-05 DIAGNOSIS — I130 Hypertensive heart and chronic kidney disease with heart failure and stage 1 through stage 4 chronic kidney disease, or unspecified chronic kidney disease: Principal | ICD-10-CM | POA: Diagnosis present

## 2018-07-05 DIAGNOSIS — E872 Acidosis: Secondary | ICD-10-CM | POA: Diagnosis present

## 2018-07-05 DIAGNOSIS — J9601 Acute respiratory failure with hypoxia: Secondary | ICD-10-CM | POA: Diagnosis present

## 2018-07-05 DIAGNOSIS — I252 Old myocardial infarction: Secondary | ICD-10-CM

## 2018-07-05 DIAGNOSIS — I5033 Acute on chronic diastolic (congestive) heart failure: Secondary | ICD-10-CM | POA: Diagnosis not present

## 2018-07-05 DIAGNOSIS — I10 Essential (primary) hypertension: Secondary | ICD-10-CM | POA: Diagnosis not present

## 2018-07-05 DIAGNOSIS — Z955 Presence of coronary angioplasty implant and graft: Secondary | ICD-10-CM

## 2018-07-05 DIAGNOSIS — I249 Acute ischemic heart disease, unspecified: Secondary | ICD-10-CM | POA: Diagnosis not present

## 2018-07-05 DIAGNOSIS — Z8249 Family history of ischemic heart disease and other diseases of the circulatory system: Secondary | ICD-10-CM

## 2018-07-05 DIAGNOSIS — IMO0002 Reserved for concepts with insufficient information to code with codable children: Secondary | ICD-10-CM | POA: Diagnosis present

## 2018-07-05 DIAGNOSIS — J810 Acute pulmonary edema: Secondary | ICD-10-CM | POA: Diagnosis present

## 2018-07-05 DIAGNOSIS — Z7901 Long term (current) use of anticoagulants: Secondary | ICD-10-CM

## 2018-07-05 DIAGNOSIS — I5041 Acute combined systolic (congestive) and diastolic (congestive) heart failure: Secondary | ICD-10-CM | POA: Diagnosis not present

## 2018-07-05 DIAGNOSIS — E785 Hyperlipidemia, unspecified: Secondary | ICD-10-CM | POA: Diagnosis present

## 2018-07-05 DIAGNOSIS — I255 Ischemic cardiomyopathy: Secondary | ICD-10-CM | POA: Diagnosis present

## 2018-07-05 DIAGNOSIS — I5020 Unspecified systolic (congestive) heart failure: Secondary | ICD-10-CM | POA: Diagnosis present

## 2018-07-05 DIAGNOSIS — T508X5A Adverse effect of diagnostic agents, initial encounter: Secondary | ICD-10-CM | POA: Diagnosis present

## 2018-07-05 LAB — CBC WITH DIFFERENTIAL/PLATELET
Abs Immature Granulocytes: 0.07 10*3/uL (ref 0.00–0.07)
Basophils Absolute: 0 10*3/uL (ref 0.0–0.1)
Basophils Relative: 0 %
Eosinophils Absolute: 0 10*3/uL (ref 0.0–0.5)
Eosinophils Relative: 0 %
HCT: 41.3 % (ref 39.0–52.0)
Hemoglobin: 13.6 g/dL (ref 13.0–17.0)
Immature Granulocytes: 1 %
Lymphocytes Relative: 8 %
Lymphs Abs: 0.9 10*3/uL (ref 0.7–4.0)
MCH: 29.2 pg (ref 26.0–34.0)
MCHC: 32.9 g/dL (ref 30.0–36.0)
MCV: 88.8 fL (ref 80.0–100.0)
Monocytes Absolute: 0.2 10*3/uL (ref 0.1–1.0)
Monocytes Relative: 2 %
Neutro Abs: 9.6 10*3/uL — ABNORMAL HIGH (ref 1.7–7.7)
Neutrophils Relative %: 89 %
Platelets: 254 10*3/uL (ref 150–400)
RBC: 4.65 MIL/uL (ref 4.22–5.81)
RDW: 14.5 % (ref 11.5–15.5)
WBC: 10.8 10*3/uL — ABNORMAL HIGH (ref 4.0–10.5)
nRBC: 0 % (ref 0.0–0.2)

## 2018-07-05 LAB — COMPREHENSIVE METABOLIC PANEL
ALT: 28 U/L (ref 0–44)
AST: 27 U/L (ref 15–41)
Albumin: 3 g/dL — ABNORMAL LOW (ref 3.5–5.0)
Alkaline Phosphatase: 104 U/L (ref 38–126)
Anion gap: 10 (ref 5–15)
BUN: 29 mg/dL — ABNORMAL HIGH (ref 8–23)
CO2: 22 mmol/L (ref 22–32)
Calcium: 8.9 mg/dL (ref 8.9–10.3)
Chloride: 105 mmol/L (ref 98–111)
Creatinine, Ser: 2.14 mg/dL — ABNORMAL HIGH (ref 0.61–1.24)
GFR calc Af Amer: 36 mL/min — ABNORMAL LOW (ref >60)
GFR calc non Af Amer: 31 mL/min — ABNORMAL LOW (ref >60)
Glucose, Bld: 240 mg/dL — ABNORMAL HIGH (ref 70–99)
Potassium: 4.5 mmol/L (ref 3.5–5.1)
Sodium: 137 mmol/L (ref 135–145)
Total Bilirubin: 0.7 mg/dL (ref 0.3–1.2)
Total Protein: 6.6 g/dL (ref 6.5–8.1)

## 2018-07-05 LAB — MAGNESIUM: Magnesium: 1.7 mg/dL (ref 1.7–2.4)

## 2018-07-05 LAB — GLUCOSE, CAPILLARY
Glucose-Capillary: 205 mg/dL — ABNORMAL HIGH (ref 70–99)
Glucose-Capillary: 342 mg/dL — ABNORMAL HIGH (ref 70–99)

## 2018-07-05 LAB — TROPONIN I (HIGH SENSITIVITY)
Troponin I (High Sensitivity): 454 ng/L (ref <18)
Troponin I (High Sensitivity): 531 ng/L (ref <18)

## 2018-07-05 LAB — APTT: aPTT: 41 seconds — ABNORMAL HIGH (ref 24–36)

## 2018-07-05 LAB — PROTIME-INR
INR: 1.1 (ref 0.8–1.2)
Prothrombin Time: 14.4 seconds (ref 11.4–15.2)

## 2018-07-05 LAB — TSH: TSH: 1.476 u[IU]/mL (ref 0.350–4.500)

## 2018-07-05 LAB — BRAIN NATRIURETIC PEPTIDE: B Natriuretic Peptide: 546.7 pg/mL — ABNORMAL HIGH (ref 0.0–100.0)

## 2018-07-05 LAB — HEPARIN LEVEL (UNFRACTIONATED): Heparin Unfractionated: 1.84 IU/mL — ABNORMAL HIGH (ref 0.30–0.70)

## 2018-07-05 LAB — HEMOGLOBIN A1C
Hgb A1c MFr Bld: 8.4 % — ABNORMAL HIGH (ref 4.8–5.6)
Mean Plasma Glucose: 194.38 mg/dL

## 2018-07-05 MED ORDER — SODIUM CHLORIDE 0.9% FLUSH
3.0000 mL | Freq: Two times a day (BID) | INTRAVENOUS | Status: DC
  Administered 2018-07-05 – 2018-07-10 (×8): 3 mL via INTRAVENOUS

## 2018-07-05 MED ORDER — FUROSEMIDE 10 MG/ML IJ SOLN
60.0000 mg | Freq: Every day | INTRAMUSCULAR | Status: DC
  Administered 2018-07-05 – 2018-07-06 (×2): 60 mg via INTRAVENOUS
  Filled 2018-07-05 (×2): qty 6

## 2018-07-05 MED ORDER — ATORVASTATIN CALCIUM 80 MG PO TABS
80.0000 mg | ORAL_TABLET | Freq: Every day | ORAL | Status: DC
  Administered 2018-07-06 – 2018-07-09 (×4): 80 mg via ORAL
  Filled 2018-07-05 (×4): qty 1

## 2018-07-05 MED ORDER — ASPIRIN 300 MG RE SUPP: 300.0000 mg | RECTAL | Status: AC

## 2018-07-05 MED ORDER — SODIUM CHLORIDE 0.9 % IV SOLN: 250.0000 mL | INTRAVENOUS | Status: DC | PRN

## 2018-07-05 MED ORDER — LOSARTAN POTASSIUM 50 MG PO TABS
50.0000 mg | ORAL_TABLET | Freq: Every day | ORAL | Status: DC
  Administered 2018-07-06: 50 mg via ORAL
  Filled 2018-07-05: qty 1

## 2018-07-05 MED ORDER — HEPARIN BOLUS VIA INFUSION
1000.0000 [IU] | Freq: Once | INTRAVENOUS | Status: AC
  Administered 2018-07-05: 1000 [IU] via INTRAVENOUS
  Filled 2018-07-05: qty 1000

## 2018-07-05 MED ORDER — CARVEDILOL 25 MG PO TABS
25.0000 mg | ORAL_TABLET | Freq: Two times a day (BID) | ORAL | Status: DC
  Administered 2018-07-06 – 2018-07-10 (×9): 25 mg via ORAL
  Filled 2018-07-05 (×9): qty 1

## 2018-07-05 MED ORDER — NITROGLYCERIN 0.4 MG SL SUBL: 0.4000 mg | SUBLINGUAL_TABLET | SUBLINGUAL | Status: DC | PRN

## 2018-07-05 MED ORDER — INSULIN ASPART 100 UNIT/ML ~~LOC~~ SOLN
0.0000 [IU] | Freq: Three times a day (TID) | SUBCUTANEOUS | Status: DC
  Administered 2018-07-06: 10:00:00 8 [IU] via SUBCUTANEOUS

## 2018-07-05 MED ORDER — ASPIRIN EC 81 MG PO TBEC
81.0000 mg | DELAYED_RELEASE_TABLET | Freq: Every day | ORAL | Status: DC
  Administered 2018-07-06 – 2018-07-08 (×3): 81 mg via ORAL
  Filled 2018-07-05 (×3): qty 1

## 2018-07-05 MED ORDER — LEVOTHYROXINE SODIUM 25 MCG PO TABS
25.0000 ug | ORAL_TABLET | Freq: Every day | ORAL | Status: DC
  Administered 2018-07-06 – 2018-07-10 (×5): 25 ug via ORAL
  Filled 2018-07-05 (×5): qty 1

## 2018-07-05 MED ORDER — ASPIRIN 81 MG PO CHEW
324.0000 mg | CHEWABLE_TABLET | ORAL | Status: AC
  Filled 2018-07-05: qty 4

## 2018-07-05 MED ORDER — HYDRALAZINE HCL 20 MG/ML IJ SOLN: 10.0000 mg | Freq: Four times a day (QID) | INTRAMUSCULAR | Status: DC | PRN

## 2018-07-05 MED ORDER — SODIUM CHLORIDE 0.9% FLUSH: 3.0000 mL | INTRAVENOUS | Status: DC | PRN

## 2018-07-05 MED ORDER — ONDANSETRON HCL 4 MG/2ML IJ SOLN: 4.0000 mg | Freq: Four times a day (QID) | INTRAMUSCULAR | Status: DC | PRN

## 2018-07-05 MED ORDER — HEPARIN (PORCINE) 25000 UT/250ML-% IV SOLN
1900.0000 [IU]/h | INTRAVENOUS | Status: DC
  Administered 2018-07-05: 1000 [IU]/h via INTRAVENOUS
  Administered 2018-07-07: 10:00:00 1900 [IU]/h via INTRAVENOUS
  Filled 2018-07-05 (×3): qty 250

## 2018-07-05 MED ORDER — PANTOPRAZOLE SODIUM 40 MG PO TBEC
40.0000 mg | DELAYED_RELEASE_TABLET | Freq: Every day | ORAL | Status: DC
  Administered 2018-07-06 – 2018-07-10 (×5): 40 mg via ORAL
  Filled 2018-07-05 (×5): qty 1

## 2018-07-05 MED ORDER — ACETAMINOPHEN 325 MG PO TABS: 650.0000 mg | ORAL_TABLET | ORAL | Status: DC | PRN

## 2018-07-05 NOTE — H&P (Signed)
History and Physical:    Adam Carney   NWG:[id number] DOB: [birth year] DOA: 07/05/2018  Referring MD/provider: Dr Denyse Dago PCP: [place], Sarah T, MD   Patient coming from: Home  Chief Complaint: Acute shortness of breath.  History as per patient, his wife and PA from [place] who discussed this with EMT.  History of Present Illness:   Adam Carney is an 68 y.o. male with past medical history significant for CAD status post STEMI with cardiac arrest 3 years ago with resultant anoxic brain injury and ischemic cardiomyopathy who was in his usual state of health until this morning when he developed shortness of breath while sitting on the couch.  Patient states he woke up this morning and felt his usual self.  He was able to walk down stairs without any difficulty.  He took his medications and then sat on the couch and started developing some shortness of breath.  Apparently the shortness of breath progressed very quickly to the point where he was unable to talk.  His wife noted that he very quickly became diaphoretic and developed a gray ashen blue color.  Apparently patient was acutely hypoxic in the field and they tried to put him on BiPAP however it was not successful and that he remained hypoxic.  Patient was subsequently ventilated with an Ambu bag and treated with Nitropaste and Lasix 80 mg with significant improvement in his respiratory status with decrease in hypoxia.  Patient was then placed on BiPAP and quickly weaned off to 2 L of nasal cannula with O2 sats in the 99%.  [name] apparently only put out 300 cc to the Lasix.  Patient states that he really felt fine when he woke up this morning prior to developing shortness of breath patient did not notice any chest pain pressure or palpitations.  He denies any neck shoulder or arm pain either.  Patient denies any recent fevers or chills.  Has had a nonproductive cough for about 2 weeks.  He has not noticed any new DOE however  his exercise tolerance is severely limited to about 20 steps due to leg pain.  Patient does not stop secondary to shortness of breath according to him and his wife.  ED Course:  The patient underwent a CT angiogram which was negative for pulmonary embolus.  He did have vascular congestion which was also seen on chest x-ray.  Initial troponin was 0.32 and second troponin was 0.62.  Patient was started on heparin, given an aspirin and sent to [facility] for ongoing management of CHF and ACS.  ROS:   ROS   Review of Systems: General: No fever, chills, weight changes Endocrine: no heat/cold intolerance, no polyuria Respiratory: Positive cough,, no shortness of breath, hemoptysis Cardiovascular: No palpitations, chest pain GI: No nausea, vomiting, diarrhea, constipation GU: No dysuria, increased frequency CNS: No numbness, dizziness, headache   Past Medical History:   Past Medical History:  Diagnosis Date  . Diabetes mellitus without complication ([place])   . Hyperlipidemia   . Hypertension     Past Surgical History:   Past Surgical History:  Procedure Laterality Date  . CORONARY ANGIOPLASTY      Social History:   Social History   Socioeconomic History  . Marital status: Married    Spouse name: Not on file  . Number of children: Not on file  . Years of education: Not on file  . Highest education level: Not on file  Occupational History  . Not on  file  Social Needs  . Financial resource strain: Not on file  . Food insecurity    Worry: Not on file    Inability: Not on file  . Transportation needs    Medical: Not on file    Non-medical: Not on file  Tobacco Use  . Smoking status: Never Smoker  . Smokeless tobacco: Never Used  Substance and Sexual Activity  . Alcohol use: No  . Drug use: No  . Sexual activity: Not on file  Lifestyle  . Physical activity    Days per week: Not on file    Minutes per session: Not on file  . Stress: Not on file  Relationships  .  Social Herbalist on phone: Not on file    Gets together: Not on file    Attends religious service: Not on file    Active member of club or organization: Not on file    Attends meetings of clubs or organizations: Not on file    Relationship status: Not on file  . Intimate partner violence    Fear of current or ex partner: Not on file    Emotionally abused: Not on file    Physically abused: Not on file    Forced sexual activity: Not on file  Other Topics Concern  . Not on file  Social History Narrative  . Not on file    Allergies   Zetia [ezetimibe] and Lisinopril  Family history:   Family History  Problem Relation Age of Onset  . Diabetes Mother   . Hypertension Mother   . Hyperlipidemia Mother   . AAA (abdominal aortic aneurysm) Father     Current Medications:   Prior to Admission medications   Medication Sig Start Date End Date Taking? Authorizing Provider  apixaban (ELIQUIS) 5 MG TABS tablet Take 1 tablet (5 mg total) 2 (two) times daily by mouth. 11/27/16   Park Liter, MD  atorvastatin (LIPITOR) 80 MG tablet TAKE 1 TABLET BY MOUTH EVERY DAY 04/24/18   Park Liter, MD  carvedilol (COREG) 25 MG tablet TAKE 1 TABLET BY MOUTH TWICE A DAY 06/19/18   Park Liter, MD  cyclobenzaprine (FLEXERIL) 10 MG tablet Take 1 tablet by mouth as needed. 03/21/17   [provider]  furosemide (LASIX) 20 MG tablet Take 1 tablet (20 mg total) by mouth 3 (three) times daily. 08/15/17 11/29/18  Park Liter, MD  insulin aspart (NOVOLOG) 100 UNIT/ML FlexPen Use as directed. Max daily dose is 150 units 06/01/17   [provider]  liraglutide (VICTOZA) 18 MG/3ML SOPN TAKE 0.6 MG (0.6ML) SUBQ DAILY FOR 1 WEEK, THEN INCREASE TO 1.2MG( 1.2ML) SUB Q WEEKLY. 12/31/15   [provider]  losartan (COZAAR) 50 MG tablet Take 1 tablet (50 mg total) by mouth daily. 08/11/16 11/29/18  Park Liter, MD  omeprazole (PRILOSEC) 20 MG capsule  TAKE 1 CAPSULE BY MOUTH EVERY DAY 01/07/18   Park Liter, MD  TRESIBA FLEXTOUCH 100 UNIT/ML SOPN FlexTouch Pen 50 units in the morning and 60 units at night 10/31/17   [provider]    Physical Exam:   Vitals:   07/05/18 1815  BP: (!) 190/104  Pulse: 91  Resp: 18  Temp: 98.1 F (36.7 C)  TempSrc: Oral  SpO2: 98%     Physical Exam: Blood pressure (!) 190/104, pulse 91, temperature 98.1 F (36.7 C), temperature source Oral, resp. rate 18, SpO2 98 %. Gen: Obese patient  lying at about 10 degrees in no acute respiratory distress.  He is able to answer questions appropriately without respiratory difficulty. Eyes: Sclerae anicteric. Conjunctiva mildly injected. Chest: Moderately good air entry bilaterally with rales at bases bilaterally. CV: Distant, regular, 2/6 systolic murmur left sternal border. Abdomen: Obese, NABS, soft, nondistended, nontender. No tenderness to light or deep palpation. No rebound, no guarding. Extremities: Trace to 1+ edema.   Neuro: Alert and oriented times 3; grossly nonfocal.  Does have that slightly confused look that is sometimes associated with anoxic encephalopathy.  He is also slightly irritable. Psych: Patient is cooperative, logical and coherent with appropriate mood and affect.  Data Review:    Labs:  From [place]:  WBC 15.6 hemoglobin 14.7 hematocrit 44.9 platelets 296 no left shift D-dimer 845 Lactic acid 2.5 Sodium 139 potassium 4.4 chloride 106 bicarb 22 glucose 276 BUN 25 creatinine 1.8 EGFR 38 anion gap 15 troponin 0.3 2 repeat troponin 0 0.612 NT proBNP 1730 LDH 497 CRP 13.7 Procalcitonin less than 0.05 SARS-CoV-2 not detected   CBG: Recent Labs  Lab 07/05/18 1821  GLUCAP 205*    Urinalysis From [place]: UA 1.010: trace glucose and 3+ protein   Radiographic Studies:  Gravity chest x-ray: Impression: Cardiomegaly with pulmonary vascular congestion.  No edema or consolidation.  [place] [place] CT  angiogram: Cardiovascular: Mild cardiomegaly.  No pericardial effusion.  Dilated central pulmonary arteries suggesting pulmonary artery hypertension.  No evidence of pulmonary emboli. Impression: 1.  Negative for acute PE or thoracic aortic dissection. 2 tiny bilateral pleural effusions. 3 bilateral pulmonary airspace and [place] opacities favor edema/alveolitis over pneumonia 4.  Coronary and aortic atherosclerosis 5.  Dilated central pulmonary arteries suggesting pulmonary arterial hypertension.  EKG: KG from [place] independently reviewed by me: Sinus rhythm at 80.  Normal intervals.  Q waves in 41F and V1.  Mild T wave inversions 1, L, V1 and V2.  Maybe half a millimeter upsloping ST segments 2, aVF   Assessment/Plan:   Principal Problem:   ACS (acute coronary syndrome) (HCC) Active Problems:   Coronary artery disease involving native coronary artery of native heart without angina pectoris   Essential hypertension   Ischemic cardiomyopathy   Systolic congestive heart failure (HCC)   Type 2 diabetes mellitus with complication (HCC)   Acute pulmonary edema ([place])   68 year old man with history of STEMI status post cardiac arrest 3 years ago now presents with acute pulmonary edema with no chest discomfort.  EKG has some mild T wave inversions anterior laterally with insignificant ST changes inferiorly.  Luminary edema has been managed with Nitropaste and Lasix and patient is oxygenating well at present.  ACS Patient received aspirin at [facility] which we will continue Patient was started on heparin at [facility] which we will continue.   Continue to trend enzymes and repeat EKG in the morning Patient is presently chest pain/discomfort free but of course he did not have any symptoms this morning either Keep patient on telemetry with continuous pulse oxygenation monitor for worsening hypoxia Continue patient's carvedilol 25 p.o. twice daily Continue atorvastatin 80 mg daily  Continue losartan 50 mg daily Not consulted cardiology tonight however it might be beneficial to contact them in the morning.  He is followed by Dr. Agustin Cree as an outpatient.  Of note patient is not on an antiplatelet agent on home meds according to his wife who manages his medications.  Apparently his aspirin was discontinued 3 years ago and his Brilinta was discontinued 2 years ago.  His  wife is under the impression that this was because he was on Eliquis as his blood thinner.  Not entirely sure what the indication for Eliquis is.  Patient and wife both deny that he has atrial fibrillation or an irregular heart rate.  [place]  Patient has a known EF of 45 to 50% He is Lasix 60 mg p.o. daily. Have changed that to 60 mg IV daily start today Echocardiogram ordered for the morning.  HTN Is quite hypertensive present, likely secondary to volume overload He apparently took all of his medications this morning per his report. I am restarting his antihypertensives including carvedilol, losartan and adding IV Lasix Also adding IV hydralazine as needed which will act as an afterload reducer as well.  I am starting at 10 mg but this might need to be uptitrated depending upon his response.  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 No evidence for new acute focal neurologic event. Continue atorvastatin. Noted above patient does not seem to be on an antiplatelet agent however he is on Eliquis.  ANOXIC ENCEPHALOPATHY Stable.  Patient clearly has some memory difficulties.  His wife is very knowledgeable about him and his medications.    Other information:   DVT prophylaxis: On full dose heparin Code Status: Full code. Family Communication: Spoke with patient's wife at length on the phone Disposition  Plan: Home Consults called: None. however cardiology will likely need to be consulted in the morning. Admission status: Inpatient  The medical decision making on this patient was of high complexity and the patient is at high risk for clinical deterioration, therefore this is a level 3 visit.  Dewaine Oats Tublu Secret Kristensen [facility]  If 7PM-7AM, please contact night-coverage [email or web address] Password [email or web address] 07/05/2018, 7:24 PM

## 2018-07-05 NOTE — Progress Notes (Signed)
ANTICOAGULATION CONSULT NOTE - Initial Consult  Pharmacy Consult for Heparin Indication: chest pain/ACS  Allergies  Allergen Reactions  . Zetia [Ezetimibe] Diarrhea  . Lisinopril Cough    Patient Measurements: Height: 5\' 11"  (180.3 cm) Weight: 293 lb 10.4 oz (133.2 kg) IBW/kg (Calculated) : 75.3 Heparin Dosing Weight: 105.8 kg  Vital Signs: Temp: 98.1 F (36.7 C) (06/26 1815) Temp Source: Oral (06/26 1815) BP: 190/104 (06/26 1815) Pulse Rate: 91 (06/26 1815)  Labs: No results for input(s): HGB, HCT, PLT, APTT, LABPROT, INR, HEPARINUNFRC, HEPRLOWMOCWT, CREATININE, CKTOTAL, CKMB, TROPONINIHS in the last 72 hours.  CrCl cannot be calculated (No successful lab value found.).   Medical History: Past Medical History:  Diagnosis Date  . Diabetes mellitus without complication ([place])   . Hyperlipidemia   . Hypertension     Medications:  Heparin drip running at 1000 units/hr (10 ml/hr)  Assessment: 67 YOM transferred from [facility] for management of ACS/CP. The patient was already started on Heparin prior to transfer - pharmacy consulted to manage dosing this admission.   Of noting, the patient was on Apixaban PTA. Per chart review - it appears to be for hx CVA. The patient did take his dose earlier today at 0730.   Per [facility] records at [facility] - the patient was started on Heparin 4000 unit bolus x 1 and a drip rate of 1000 units/hr (10 ml/hr) at 1415 today. Hgb 14.7, plts 296, SCr 1.8, INR 1, aPTT 26.7.   Our protocol would have been to wait ~12h from the last apixaban dose prior to starting Heparin. Will obtain a HL/aPTT to address dose adjustments.   Goal of Therapy:  Heparin level 0.3-0.7 units/ml aPTT 66-102 seconds Monitor platelets by anticoagulation protocol: Yes   Plan:  - Continue Heparin at 1000 units/h (10 ml/hr) - started at [place] a HL/aPTT at 1830 today - Will address adjustments after these levels are resulted - Daily CBC, aPTT/HL until  correlated  Thank you for allowing pharmacy to be a part of this patient's care.  Alycia Rossetti, PharmD, BCPS Clinical Pharmacist Clinical phone for 07/05/2018: [phone] 07/05/2018 7:40 PM   **Pharmacist phone directory can now be found on [email or web address] ([email or web address]).  Listed under [place].

## 2018-07-05 NOTE — Progress Notes (Signed)
ANTICOAGULATION CONSULT NOTE - Initial Consult  Pharmacy Consult for Heparin Indication: chest pain/ACS  Allergies  Allergen Reactions  . Zetia [Ezetimibe] Diarrhea  . Lisinopril Cough    Patient Measurements: Height: 5\' 11"  (180.3 cm) Weight: 293 lb 10.4 oz (133.2 kg) IBW/kg (Calculated) : 75.3 Heparin Dosing Weight: 105.8 kg  Vital Signs: Temp: 98.3 F (36.8 C) (06/26 2004) Temp Source: Oral (06/26 2004) BP: 165/82 (06/26 2004) Pulse Rate: 95 (06/26 2004)  Labs: Recent Labs    07/05/18 2006 07/05/18 2008  HGB 13.6  --   HCT 41.3  --   PLT 254  --   APTT 41*  --   LABPROT 14.4  --   INR 1.1  --   HEPARINUNFRC  --  1.84*  CREATININE 2.14*  --   TROPONINIHS 454*  --     Estimated Creatinine Clearance: 46 mL/min (A) (by C-G formula based on SCr of 2.14 mg/dL (H)).   Assessment: 58 YOM transferred from [facility] for management of ACS/CP. The patient was already started on Heparin prior to transfer   Of noting, the patient was on Apixaban PTA. Per chart review - it appears to be for hx CVA. The patient did take his dose earlier today at 0730.   Per [facility] records at [facility] - the patient was started on Heparin 4000 unit bolus x 1 and a drip rate of 1000 units/hr (10 ml/hr) at 1415 today. Hgb 14.7, plts 296, SCr 1.8, INR 1, aPTT 26.7.   HL 1.84, aptt 41  Goal of Therapy:  Heparin level 0.3-0.7 units/ml aPTT 66-102 seconds Monitor platelets by anticoagulation protocol: Yes   Plan:  Bolus heparin 1000 units x 1 Increase heparin gtt 1200 units/hr Daily HL aPTT  Levester Fresh, PharmD, BCPS, BCCCP Clinical Pharmacist [phone]  Please check AMION for all [place] numbers  07/05/2018 9:53 PM

## 2018-07-06 ENCOUNTER — Inpatient Hospital Stay (HOSPITAL_COMMUNITY): Payer: Medicare HMO

## 2018-07-06 DIAGNOSIS — R778 Other specified abnormalities of plasma proteins: Secondary | ICD-10-CM | POA: Diagnosis present

## 2018-07-06 DIAGNOSIS — J810 Acute pulmonary edema: Secondary | ICD-10-CM

## 2018-07-06 DIAGNOSIS — E1165 Type 2 diabetes mellitus with hyperglycemia: Secondary | ICD-10-CM | POA: Diagnosis present

## 2018-07-06 DIAGNOSIS — N184 Chronic kidney disease, stage 4 (severe): Secondary | ICD-10-CM | POA: Diagnosis present

## 2018-07-06 DIAGNOSIS — I5033 Acute on chronic diastolic (congestive) heart failure: Secondary | ICD-10-CM

## 2018-07-06 DIAGNOSIS — I255 Ischemic cardiomyopathy: Secondary | ICD-10-CM

## 2018-07-06 DIAGNOSIS — IMO0002 Reserved for concepts with insufficient information to code with codable children: Secondary | ICD-10-CM | POA: Diagnosis present

## 2018-07-06 DIAGNOSIS — I5023 Acute on chronic systolic (congestive) heart failure: Secondary | ICD-10-CM | POA: Diagnosis present

## 2018-07-06 DIAGNOSIS — I248 Other forms of acute ischemic heart disease: Secondary | ICD-10-CM | POA: Diagnosis present

## 2018-07-06 DIAGNOSIS — I2510 Atherosclerotic heart disease of native coronary artery without angina pectoris: Secondary | ICD-10-CM

## 2018-07-06 LAB — TROPONIN I (HIGH SENSITIVITY)
Troponin I (High Sensitivity): 316 ng/L (ref <18)
Troponin I (High Sensitivity): 427 ng/L (ref <18)

## 2018-07-06 LAB — HEPARIN LEVEL (UNFRACTIONATED): Heparin Unfractionated: 1.03 IU/mL — ABNORMAL HIGH (ref 0.30–0.70)

## 2018-07-06 LAB — CBC
HCT: 38 % — ABNORMAL LOW (ref 39.0–52.0)
Hemoglobin: 12.5 g/dL — ABNORMAL LOW (ref 13.0–17.0)
MCH: 29.7 pg (ref 26.0–34.0)
MCHC: 32.9 g/dL (ref 30.0–36.0)
MCV: 90.3 fL (ref 80.0–100.0)
Platelets: 231 10*3/uL (ref 150–400)
RBC: 4.21 MIL/uL — ABNORMAL LOW (ref 4.22–5.81)
RDW: 14.5 % (ref 11.5–15.5)
WBC: 14.9 10*3/uL — ABNORMAL HIGH (ref 4.0–10.5)
nRBC: 0 % (ref 0.0–0.2)

## 2018-07-06 LAB — BASIC METABOLIC PANEL
Anion gap: 16 — ABNORMAL HIGH (ref 5–15)
BUN: 38 mg/dL — ABNORMAL HIGH (ref 8–23)
CO2: 19 mmol/L — ABNORMAL LOW (ref 22–32)
Calcium: 8.7 mg/dL — ABNORMAL LOW (ref 8.9–10.3)
Chloride: 100 mmol/L (ref 98–111)
Creatinine, Ser: 2.65 mg/dL — ABNORMAL HIGH (ref 0.61–1.24)
GFR calc Af Amer: 27 mL/min — ABNORMAL LOW (ref >60)
GFR calc non Af Amer: 24 mL/min — ABNORMAL LOW (ref >60)
Glucose, Bld: 315 mg/dL — ABNORMAL HIGH (ref 70–99)
Potassium: 4.6 mmol/L (ref 3.5–5.1)
Sodium: 135 mmol/L (ref 135–145)

## 2018-07-06 LAB — GLUCOSE, CAPILLARY
Glucose-Capillary: 299 mg/dL — ABNORMAL HIGH (ref 70–99)
Glucose-Capillary: 331 mg/dL — ABNORMAL HIGH (ref 70–99)
Glucose-Capillary: 259 mg/dL — ABNORMAL HIGH (ref 70–99)
Glucose-Capillary: 273 mg/dL — ABNORMAL HIGH (ref 70–99)

## 2018-07-06 LAB — ECHOCARDIOGRAM COMPLETE
Height: 71 in
Weight: 4812.8 oz

## 2018-07-06 LAB — APTT
aPTT: 39 seconds — ABNORMAL HIGH (ref 24–36)
aPTT: 45 seconds — ABNORMAL HIGH (ref 24–36)

## 2018-07-06 LAB — HIV ANTIBODY (ROUTINE TESTING W REFLEX): HIV Screen 4th Generation wRfx: NONREACTIVE

## 2018-07-06 MED ORDER — INSULIN GLARGINE 100 UNIT/ML ~~LOC~~ SOLN
10.0000 [IU] | Freq: Every day | SUBCUTANEOUS | Status: DC
  Administered 2018-07-06 – 2018-07-07 (×2): 10 [IU] via SUBCUTANEOUS
  Filled 2018-07-06 (×2): qty 0.1

## 2018-07-06 MED ORDER — HYDRALAZINE HCL 50 MG PO TABS
50.0000 mg | ORAL_TABLET | Freq: Three times a day (TID) | ORAL | Status: DC
  Administered 2018-07-06 – 2018-07-10 (×12): 50 mg via ORAL
  Filled 2018-07-06 (×13): qty 1

## 2018-07-06 MED ORDER — INSULIN ASPART 100 UNIT/ML ~~LOC~~ SOLN
0.0000 [IU] | SUBCUTANEOUS | Status: DC
  Administered 2018-07-06: 8 [IU] via SUBCUTANEOUS
  Administered 2018-07-06: 11 [IU] via SUBCUTANEOUS
  Administered 2018-07-06 (×2): 8 [IU] via SUBCUTANEOUS
  Administered 2018-07-07 (×3): 5 [IU] via SUBCUTANEOUS
  Administered 2018-07-07: 3 [IU] via SUBCUTANEOUS
  Administered 2018-07-07: 8 [IU] via SUBCUTANEOUS
  Administered 2018-07-07: 5 [IU] via SUBCUTANEOUS
  Administered 2018-07-08: 2 [IU] via SUBCUTANEOUS
  Administered 2018-07-08: 5 [IU] via SUBCUTANEOUS
  Administered 2018-07-08: 2 [IU] via SUBCUTANEOUS
  Administered 2018-07-08 (×2): 3 [IU] via SUBCUTANEOUS

## 2018-07-06 MED ORDER — ISOSORBIDE MONONITRATE ER 30 MG PO TB24
30.0000 mg | ORAL_TABLET | Freq: Every day | ORAL | Status: DC
  Administered 2018-07-06 – 2018-07-10 (×5): 30 mg via ORAL
  Filled 2018-07-06 (×5): qty 1

## 2018-07-06 MED ORDER — PERFLUTREN LIPID MICROSPHERE
1.0000 mL | INTRAVENOUS | Status: AC | PRN
  Administered 2018-07-06: 4 mL via INTRAVENOUS
  Filled 2018-07-06: qty 10

## 2018-07-06 NOTE — Progress Notes (Addendum)
ANTICOAGULATION CONSULT NOTE - Follow Up Consult  Pharmacy Consult for heparin Indication: chest pain/ACS  Allergies  Allergen Reactions  . Zetia [Ezetimibe] Diarrhea  . Lisinopril Cough    Patient Measurements: Height: 5\' 11"  (180.3 cm) Weight: (!) 300 lb 12.8 oz (136.4 kg) IBW/kg (Calculated) : 75.3 Heparin Dosing Weight: 106 kg  Vital Signs: Temp: 97.9 F (36.6 C) (06/27 0526) Temp Source: Oral (06/27 0526) BP: 143/77 (06/27 0526) Pulse Rate: 83 (06/27 0526)  Labs: Recent Labs    07/05/18 2006 07/05/18 2008 07/06/18 0705  HGB 13.6  --   --   HCT 41.3  --   --   PLT 254  --   --   APTT 41*  --  39*  LABPROT 14.4  --   --   INR 1.1  --   --   HEPARINUNFRC  --  1.84* 1.03*  CREATININE 2.14*  --  2.65*  TROPONINIHS 531*  454*  --   --     Estimated Creatinine Clearance: 37.6 mL/min (A) (by C-G formula based on SCr of 2.65 mg/dL (H)).   Medications:  Infusions:  . sodium chloride    . heparin 1,200 Units/hr (07/05/18 2310)    Assessment: 55 YOM transferred from [facility] for management of ACS/CP. The patient was already started on Heparin prior to transfer.  Of noting, the patient was on apixaban PTA. Per chart review - it appears to be for "substrate for potential LV thrombus" on previous echo. Per cardiology note 6/18, getting new echo and to decide on need for anticoagulation based on repeat echo. The patient did take his dose 6/26 at 0730.   Heparin level is falsely elevated by recent apixaban use at 1.03, aPTT is subtherapeutic at 39 sec. No bleeding noted, CBC normal on 6/26. Will monitor heparin drip by aPTT until heparin level and aPTT correlate.  Spoke with Rey, RN and patient pulled out IV site but was after the levels were drawn. New IV placed.  Goal of Therapy:  Heparin level 0.3-0.7 units/ml aPTT 66-102 seconds Monitor platelets by anticoagulation protocol: Yes   Plan:  Increase heparin drip to 1450 units/hr 6 hr aPTT Daily heparin  level, aPTT, CBC Monitor for s/sx of bleeding F/U repeat echo and decision made re: continued anticoagulation   Thank you for involving pharmacy in this patient's care.  Renold Genta, PharmD, BCPS Clinical Pharmacist Clinical phone for 07/06/2018 until 3p is [phone] 07/06/2018 9:05 AM  **Pharmacist phone directory can be found on [email or web address] listed under [place]**

## 2018-07-06 NOTE — Progress Notes (Signed)
CRITICAL VALUE ALERT  Critical Value:  Troponin (HS) 531  Date & Time Notied:  07/05/18 @ 2109  Provider Notified:Bodenheimer C,NP   Orders Received/Actions taken: No new order; Heparin infusing.

## 2018-07-06 NOTE — Consult Note (Addendum)
Advanced Heart Failure Team Consult Note   Primary Physician: Martinique, Sarah T, MD PCP-Cardiologist:  Adam Campus, MD  Reason for Consultation: CHF  HPI:    Adam Carney is seen today for evaluation of CHF at the request of Dr. Sherral Carney.   Adam Carney is a 68 y.o. 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 Sherral Carney.  At baseline, he had been doing pretty well at home.  No chest pain. Would work in his garden with no exertional dyspnea.  Back and upper legs will tire easily.   Adam Carney was in his usual state of health until the morning of 07/05/2018 when he developed sudden onset SOB while sitting on the couch. He felt fine upon waking that morning, walked downstairs, took his medications, and sat on the couch, when suddenly he became short of breath to the point that he was unable to talk. He was reportedly diaphoretic and pale. EMS was activated and was acutely hypoxic but not responsive to Bipap. He was ventilated with an Ambu bag and given nitropaste and Lasix 80mg  with significant improvement in respiratory status. No complaints of chest pain or palpitations during this episode. No recent changes in DOE, though activity is significantly limited by leg pain. Patient initially presented to [facility], however given elevated troponins, he was transferred to [facility] for further evaluation.  He was last evaluated by cardiology via a telemedicine visit with Dr. Agustin Carney, he was stable at that time.  Main complaint was a chronic cough. Last ischemic evaluation was a Cardiolite in 2018 which showed no reversible ischemia but evidence of scarring and hypokinesis with EF 28%.   Last echo in 2018 showed Ef 45-50%.   Hospital course: hypertensive, otherwise VSS. Labs notable for  electrolytes wnl, Cr 2.14>2.65, WBC 10.8>14.9, Hgb 13.6, PLT 254, BNP 546, High sensitivity trop 454>531>316 (Trop 0.3 and 0.6 at [facility]), TSH wnl, HgbA1C 8.4. CXR with pulmonary edema. [facility]A chest at [facility] was negative for PE.   This morning, he feels much better.  No dyspnea.  BP remains elevated, SBP 140s-160s.  Creatinine up from 2.14 to 2.65 (patient had contrast with CTA chest in ER).  No chest pain.  He has had 2 doses of IV Lasix.    Review of Systems: All systems reviewed and negative except as per HPI.   Home Medications Prior to Admission medications   Medication Sig Start Date End Date Taking? Authorizing Provider  albuterol (VENTOLIN HFA) 108 (90 Base) MCG/ACT inhaler Inhale 2 puffs into the lungs every 6 (six) hours as needed for wheezing or shortness of breath.   Yes [provider]  apixaban (ELIQUIS) 5 MG TABS tablet Take 1 tablet (5 mg total) 2 (two) times daily by mouth. 11/27/16  Yes Adam Liter, MD  Ascorbic Acid (VITAMIN C) POWD Take 1,000 mg by mouth daily. Mix in water and drink   Yes [provider]  atorvastatin (LIPITOR) 80 MG tablet TAKE 1 TABLET BY MOUTH EVERY DAY Patient taking differently: Take 80 mg by mouth daily.  04/24/18  Yes Adam Liter, MD  carvedilol (COREG) 25 MG tablet TAKE 1 TABLET BY MOUTH TWICE A DAY Patient taking differently: Take 25 mg by mouth 2 (two) times  daily with a meal.  06/19/18  Yes Adam Liter, MD  CINNAMON PO Take 1,000 mg by mouth daily.   Yes [provider]  fluticasone (FLONASE) 50 MCG/ACT nasal spray Place 1 spray into both nostrils daily.   Yes [provider]  furosemide (LASIX) 20 MG tablet Take 1 tablet (20 mg total) by mouth 3 (three) times daily. Patient taking differently: Take 60 mg by mouth daily.  08/15/17 11/29/18 Yes Adam Liter, MD  gabapentin (NEURONTIN) 300 MG capsule Take 300 mg by mouth 3 (three) times daily as needed (severe back pain).   06/04/18  Yes [provider]  insulin aspart (NOVOLOG) 100 UNIT/ML FlexPen Inject 18-42 Units into the skin 3 (three) times daily with meals. Per sliding scale based on CBG 06/01/17  Yes [provider]  insulin degludec (TRESIBA FLEXTOUCH) 100 UNIT/ML SOPN FlexTouch Pen Inject 50-60 Units into the skin See admin instructions. Inject 50 units subcutaneously every morning, inject 50-60 units at night (depending on CBG)   Yes [provider]  levothyroxine (SYNTHROID) 25 MCG tablet Take 25 mcg by mouth daily. 06/21/18  Yes [provider]  liraglutide (VICTOZA) 18 MG/3ML SOPN Inject 1.8 mg into the skin at bedtime.  12/31/15  Yes [provider]  loratadine (CLARITIN) 10 MG tablet Take 10 mg by mouth daily.   Yes [provider]  losartan (COZAAR) 100 MG tablet Take 100 mg by mouth daily. 04/11/18  Yes [provider]  omeprazole (PRILOSEC) 20 MG capsule TAKE 1 CAPSULE BY MOUTH EVERY DAY Patient taking differently: Take 20 mg by mouth daily.  01/07/18  Yes Adam Liter, MD  losartan (COZAAR) 50 MG tablet Take 1 tablet (50 mg total) by mouth daily. Patient not taking: Reported on 07/05/2018 08/11/16 11/29/18  Adam Liter, MD    Past Medical History: Past Medical History:  Diagnosis Date   Diabetes mellitus without complication ([place])    Hyperlipidemia    Hypertension     Past Surgical History: Past Surgical History:  Procedure Laterality Date   CORONARY ANGIOPLASTY      Family History: Family History  Problem Relation Age of Onset   Diabetes Mother    Hypertension Mother    Hyperlipidemia Mother    AAA (abdominal aortic aneurysm) Father     Social History: Social History   Socioeconomic History   Marital status: Married    Spouse name: Not on file   Number of children: Not on file   Years of education: Not on file   Highest education level: Not on file  Occupational History   Not on file    Social Needs   Financial resource strain: Not on file   Food insecurity    Worry: Not on file    Inability: Not on file   Transportation needs    Medical: Not on file    Non-medical: Not on file  Tobacco Use   Smoking status: Never Smoker   Smokeless tobacco: Never Used  Substance and Sexual Activity   Alcohol use: No   Drug use: No   Sexual activity: Not on file  Lifestyle   Physical activity    Days per week: Not on file    Minutes per session: Not on file   Stress: Not on file  Relationships   Social connections    Talks on phone: Not on file    Gets together: Not on file    Attends religious service: Not on file  Active member of club or organization: Not on file    Attends meetings of clubs or organizations: Not on file    Relationship status: Not on file  Other Topics Concern   Not on file  Social History Narrative   Not on file    Allergies:  Allergies  Allergen Reactions   Zetia [Ezetimibe] Diarrhea   Lisinopril Cough    Objective:    Vital Signs:   Temp:  [97.9 F (36.6 C)-98.3 F (36.8 C)] 97.9 F (36.6 C) (06/27 0526) Pulse Rate:  [83-95] 83 (06/27 0526) Resp:  [15-20] 15 (06/27 0526) BP: (143-190)/(77-104) 154/77 (06/27 0911) SpO2:  [95 %-98 %] 96 % (06/27 0526) Weight:  [133.2 kg-136.4 kg] 136.4 kg (06/27 0526) Last BM Date: 07/04/18  Weight change: Filed Weights   07/05/18 1900 07/06/18 0526  Weight: 133.2 kg (!) 136.4 kg    Intake/Output:   Intake/Output Summary (Last 24 hours) at 07/06/2018 1203 Last data filed at 07/06/2018 0658 Gross per 24 hour  Intake 487.36 ml  Output 880 ml  Net -392.64 ml      Physical Exam    General:  Obese, NAD HEENT: normal Neck: Thick. JVP does not appear elevated. Carotids 2+ bilat; no bruits. No lymphadenopathy or thyromegaly appreciated. Cor: PMI nonpalpable. Regular rate & rhythm. No rubs, gallops or murmurs. Difficult to palpate peripheral pulses.  Lungs: clear Abdomen:  soft, nontender, nondistended. No hepatosplenomegaly. No bruits or masses. Good bowel sounds. Extremities: no cyanosis, clubbing, rash. Trace ankle edema.  Neuro: alert & orientedx3, cranial nerves grossly intact. moves all 4 extremities w/o difficulty. Affect pleasant   Telemetry   NSR 70s (personally reviewed)  EKG    NSR, old inferior MI, possible old anterior MI (personally reviewed)  Labs   Basic Metabolic Panel: Recent Labs  Lab 07/05/18 Apr 13, 2004 07/06/18 0705  NA 137 135  K 4.5 4.6  CL 105 100  CO2 22 19*  GLUCOSE 240* 315*  BUN 29* 38*  CREATININE 2.14* 2.65*  CALCIUM 8.9 8.7*  MG 1.7  --     Liver Function Tests: Recent Labs  Lab 07/03/18 0813 07/05/18 April 13, 2004  AST 28 27  ALT 24 28  ALKPHOS  --  104  BILITOT  --  0.7  PROT  --  6.6  ALBUMIN  --  3.0*   No results for input(s): LIPASE, AMYLASE in the last 168 hours. No results for input(s): AMMONIA in the last 168 hours.  CBC: Recent Labs  Lab 07/05/18 04-13-04 07/06/18 0705  WBC 10.8* 14.9*  NEUTROABS 9.6*  --   HGB 13.6 12.5*  HCT 41.3 38.0*  MCV 88.8 90.3  PLT 254 231    Cardiac Enzymes: No results for input(s): CKTOTAL, CKMB, CKMBINDEX, TROPONINI in the last 168 hours.  BNP: BNP (last 3 results) Recent Labs    07/05/18 04-13-2004  BNP 546.7*    ProBNP (last 3 results) No results for input(s): PROBNP in the last 8760 hours.   CBG: Recent Labs  Lab 07/05/18 1821 07/05/18 2130-04-14 07/06/18 0742  GLUCAP 205* 342* 299*    Coagulation Studies: Recent Labs    07/05/18 04-13-04  LABPROT 14.4  INR 1.1     Imaging   Dg Chest Port 1 View  Result Date: 07/05/2018 CLINICAL DATA:  Congestive heart failure. EXAM: PORTABLE CHEST 1 VIEW COMPARISON:  July 05, 2018 FINDINGS: Bibasilar airspace opacities are again noted. The heart size is stable from prior study. There is no pneumothorax. No large pleural  effusion. There are small bilateral pleural effusions. Mild aortic calcifications are noted. There  is no acute osseous abnormality. IMPRESSION: Again noted are findings consistent with pulmonary edema as previously described. No significant oval change from x-ray earlier today. Electronically Signed   By: Constance Holster M.D.   On: 07/05/2018 20:13      Medications:     Current Medications:  aspirin EC  81 mg Oral Daily   atorvastatin  80 mg Oral q1800   carvedilol  25 mg Oral BID WC   furosemide  60 mg Intravenous Daily   insulin aspart  0-15 Units Subcutaneous Q4H   insulin glargine  10 Units Subcutaneous Daily   levothyroxine  25 mcg Oral Q0600   pantoprazole  40 mg Oral Daily   sodium chloride flush  3 mL Intravenous Q12H     Infusions:  sodium chloride     heparin 1,450 Units/hr (07/06/18 1000)      Assessment/Plan   1. Acute on chronic systolic CHF: Patient presented with episode of what sounds like flash pulmonary edema.  He has rapidly improved, denies dyspnea this morning.  Last echo in 2018 showed EF 45-50% but Cardiolite at that time showed EF 28%.  Ischemic cardiomyopathy.  It is possible that he had a hypertensive emergency with flash pulmonary edema given ongoing high BP (not sure what BP was at [facility]).  Ischemia-triggered flash pulmonary edema is also a consideration, he has known CAD.  However, TnI has been only mildly elevated with no significant trend.  On exam, he actually does not look volume overloaded currently and creatinine is up.  - He needs repeat echo, ordered today.  - Continue Coreg 25 mg bid.  - Stop losartan with AKI.  - Will start hydralazine 50 mg tid + Imdur 30 mg daily.  - Obtain renal artery dopplers.  2. CAD: Patient had anterior MI in 2017 with LAD PCI.  This was complicated by cardiac arrest and anoxic injury. No chest pain.  His flash pulmonary edema event could have been ischemia-mediated.   The troponin is mildly elevated but there is no significant trend, may be due to demand ischemia from CHF/volume overload and renal  dysfunction. ECG appears to show a prior inferior MI, and he has only a known anterior MI so possibly has had a subsequent event.  - Continue ASA 81 mg daily for now.  - Continue heparin gtt.  - Continue atorvastatin.  - Ideally would have coronary angiography to rule out new coronary disease.  However, in absence of definite ACS with chest pain and elevated creatinine, would hold off for now. Will reassess if creatinine trends back down or he develops significant chest pain.  3. AKI on CKD stage 3: His wife states that he has had CKD stage 3 for 3-4 years.  Uncertain what his creatinine baseline is.  Creatinine up from 2.14 => 2.65.  Possible component of contrast nephropathy as he had a chest CTA.  Now looks euvolemic.  - Stop losartan.  - Hold Lasix for now, would restart home 60 mg po daily tomorrow if creatinine stabilizes.  4. H/o CVA: At time of prior MI (complicated by cardiac arrest, CPR, prolonged hospitalization).  He was anticoagulated due to low EF at the time and "substrate for LV thrombus" though it appears LV thrombus was never identified.  He has been on Eliquis at home.  - He is on heparin gtt here.  - If EF on echo remains in the 45-50%  range, may be better to have him on ASA + low dose Xarelto (COMPASS regimen) rather than apixaban alone.  Atrial fibrillation has never been identified.  5. H/o anoxic encephalopathy: Has memory trouble.  6. Leg pain: Difficult to palpate pulses, ?PAD.  Will order peripheral arterial dopplers.                                                                                                                              Length of Stay: 1  Loralie Champagne, MD  07/06/2018, 12:03 PM  Advanced Heart Failure Team Pager [phone] (M-F; 7a - 4p)  Please contact [facility] for night-coverage after hours (4p -7a ) and weekends on [email or web address]

## 2018-07-06 NOTE — Progress Notes (Signed)
PROGRESS NOTE    Adam Carney  GYK:[id number] DOB: 18-Sep-1950 DOA: 07/05/2018 PCP: Martinique, Sarah T, MD   Brief Narrative:  68 y.o. WM PMHx HLD, HTN CAD status post STEMI with cardiac arrest 3 years ago with resultant anoxic brain injury and ischemic cardiomyopathy, chronic systolic CHF (EF 45 to 79%), diabetes type 2 with complication  who was in his usual state of health until this morning when he developed shortness of breath while sitting on the couch.  Patient states he woke up this morning and felt his usual self.  He was able to walk down stairs without any difficulty.  He took his medications and then sat on the couch and started developing some shortness of breath.  Apparently the shortness of breath progressed very quickly to the point where he was unable to talk.  His wife noted that he very quickly became diaphoretic and developed a gray ashen blue color.  Apparently patient was acutely hypoxic in the field and they tried to put him on BiPAP however it was not successful and that he remained hypoxic.  Patient was subsequently ventilated with an Ambu bag and treated with Nitropaste and Lasix 80 mg with significant improvement in his respiratory status with decrease in hypoxia.  Patient was then placed on BiPAP and quickly weaned off to 2 L of nasal cannula with O2 sats in the 99%.  [name] apparently only put out 300 cc to the Lasix.  Patient states that he really felt fine when he woke up this morning prior to developing shortness of breath patient did not notice any chest pain pressure or palpitations.  He denies any neck shoulder or arm pain either.  Patient denies any recent fevers or chills.  Has had a nonproductive cough for about 2 weeks.  He has not noticed any new DOE however his exercise tolerance is severely limited to about 20 steps due to leg pain.  Patient does not stop secondary to shortness of breath according to him and his wife.  ED Course:  The patient underwent a CT  angiogram which was negative for pulmonary embolus.  He did have vascular congestion which was also seen on chest x-ray.  Initial troponin was 0.32 and second troponin was 0.62.  Patient was started on heparin, given an aspirin and sent to [facility] for ongoing management of CHF and ACS.   Subjective: A/O x4, negative CP, negative S OB, negative abdominal pain.  States had previous MI.   Assessment & Plan:   Principal Problem:   ACS (acute coronary syndrome) (HCC) Active Problems:   Coronary artery disease involving native coronary artery of native heart without angina pectoris   Essential hypertension   Ischemic cardiomyopathy   Systolic congestive heart failure (HCC)   Type 2 diabetes mellitus with complication (HCC)   Acute pulmonary edema (HCC)   Demand ischemia (HCC)   Elevated troponin   Acute on chronic systolic CHF (congestive heart failure) (HCC)   CKD (chronic kidney disease), stage IV (HCC)   Diabetes mellitus type 2, uncontrolled, with complications (HCC)  ACS/NSTEMI - Elevated sensitive troponin; 454/531.  Continue trending troponin. -EKG 6/27: Inferior infarct age indeterminate -ASA 81 mg daily -Coreg 25 mg BID -Hydralazine 50 mg TID -Imdur 30 mg daily -Heparin drip -Followed by [facility] an outpatient. -Per admission note wife told admission team aspirin was discontinued 3 years ago and his Brilinta was discontinued 2 years ago.  His wife is under the impression that this was because he was  on Eliquis as his blood thinner.  -Per cardiology given patient's elevated creatinine we will hold off on coronary angiography unless patient develops significant chest pain.  - Per cardiology restart Lasix 60 mg in the a.m. if creatinine stabilizes  Elevated troponin/demand ischemia? - See ACS - Cardiology consulted  Acute on Chronic systolic CHF - See ACS - If EF on echo remains in the 45-50% range, may be better to have him on ASA + low dose Xarelto (COMPASS  regimen) rather than apixaban alone.  Essential HTN -See ACS  Flash pulmonary edema - Most likely secondary to acute on chronic CHF and HTN.  CKD stage IV? - Reviewed care everywhere and lab work from 2017 and 2019, (Cr 1.59 in July 2019 measured at [facility]) therefore unsure over time how badly his renal function has been deteriorating. -Monitor renal function carefully - Renal artery ultrasound pending  Diabetes type 2 uncontrolled with complication -8/46 hemoglobin A1c= 8.4 -Lantus 10 units daily -Moderate SSI -Hold home Victoza and Tresiba  HLD - Lipid panel within AHA/ADA guidelines -LDL goal<70 - Lipitor 80 mg daily  CVA/anoxic encephalopathy - Negative evidence of new acute neurologic insult.    PAD? - Cardiology has ordered peripheral artery Dopplers concern for PAD as patient identified leg pain to them.    DVT prophylaxis: Heparin drip  code Status: Full Family Communication: None Disposition Plan: Per cardiology   Consultants:  Cardiology    Procedures/Significant Events:  6/27 Echocardiogram:left ventricle EF = 45%-moderate concentric left ventricular hypertrophy - Left ventricular diastolic Doppler parameters are consistent with pseudonormalization.  -LV Wall Scoring: The apical septal segment, apical lateral segment, and apex are akinetic. Apical anterior segment and apical inferior segment are hypokinetic.    I have personally reviewed and interpreted all radiology studies and my findings are as above.  VENTILATOR SETTINGS:    Cultures   Antimicrobials:    Devices    LINES / TUBES:      Continuous Infusions: . sodium chloride    . heparin 1,450 Units/hr (07/06/18 1000)     Objective: Vitals:   07/06/18 0526 07/06/18 0911 07/06/18 1454 07/06/18 1626  BP: (!) 143/77 (!) 154/77 (!) 125/57 (!) 168/82  Pulse: 83  73 77  Resp: 15     Temp: 97.9 F (36.6 C)  97.9 F (36.6 C)   TempSrc: Oral  Oral   SpO2: 96%   96%   Weight: (!) 136.4 kg     Height:        Intake/Output Summary (Last 24 hours) at 07/06/2018 1729 Last data filed at 07/06/2018 1449 Gross per 24 hour  Intake 967.36 ml  Output 1280 ml  Net -312.64 ml   Filed Weights   07/05/18 1900 07/06/18 0526  Weight: 133.2 kg (!) 136.4 kg    Examination:  General: A/O x4, patient has some difficulty comprehending information.  (Baseline?)  No acute respiratory distress Eyes: negative scleral hemorrhage, negative anisocoria, negative icterus ENT: Negative Runny nose, negative gingival bleeding, Neck:  Negative scars, masses, torticollis, lymphadenopathy, JVD Lungs: Clear to auscultation bilaterally without wheezes or crackles Cardiovascular: Regular rate and rhythm without murmur gallop or rub normal S1 and S2 Abdomen: Morbidly obese, negative abdominal pain, nondistended, positive soft, bowel sounds, no rebound, no ascites, no appreciable mass Extremities: No significant cyanosis, clubbing.  Positive bilateral 1+ pedal edema. Skin: Negative rashes, lesions, ulcers Psychiatric:  Negative depression, negative anxiety, negative fatigue, negative mania  Central nervous system:  Cranial nerves II through  XII intact, tongue/uvula midline, all extremities muscle strength 5/5, sensation intact throughout,  negative dysarthria, negative expressive aphasia, negative receptive aphasia.  .     Data Reviewed: Care during the described time interval was provided by me .  I have reviewed this patient's available data, including medical history, events of note, physical examination, and all test results as part of my evaluation.   CBC: Recent Labs  Lab 07/05/18 2006 07/06/18 0705  WBC 10.8* 14.9*  NEUTROABS 9.6*  --   HGB 13.6 12.5*  HCT 41.3 38.0*  MCV 88.8 90.3  PLT 254 122   Basic Metabolic Panel: Recent Labs  Lab 07/05/18 2006 07/06/18 0705  NA 137 135  K 4.5 4.6  CL 105 100  CO2 22 19*  GLUCOSE 240* 315*  BUN 29* 38*   CREATININE 2.14* 2.65*  CALCIUM 8.9 8.7*  MG 1.7  --    GFR: Estimated Creatinine Clearance: 37.6 mL/min (A) (by C-G formula based on SCr of 2.65 mg/dL (H)). Liver Function Tests: Recent Labs  Lab 07/03/18 0813 07/05/18 2006  AST 28 27  ALT 24 28  ALKPHOS  --  104  BILITOT  --  0.7  PROT  --  6.6  ALBUMIN  --  3.0*   No results for input(s): LIPASE, AMYLASE in the last 168 hours. No results for input(s): AMMONIA in the last 168 hours. Coagulation Profile: Recent Labs  Lab 07/05/18 2006  INR 1.1   Cardiac Enzymes: No results for input(s): CKTOTAL, CKMB, CKMBINDEX, TROPONINI in the last 168 hours. BNP (last 3 results) No results for input(s): PROBNP in the last 8760 hours. HbA1C: Recent Labs    07/05/18 2006  HGBA1C 8.4*   CBG: Recent Labs  Lab 07/05/18 1821 07/05/18 2132 07/06/18 0742 07/06/18 1147 07/06/18 1622  GLUCAP 205* 342* 299* 331* 259*   Lipid Profile: No results for input(s): CHOL, HDL, LDLCALC, TRIG, CHOLHDL, LDLDIRECT in the last 72 hours. Thyroid Function Tests: Recent Labs    07/05/18 2006  TSH 1.476   Anemia Panel: No results for input(s): VITAMINB12, FOLATE, FERRITIN, TIBC, IRON, RETICCTPCT in the last 72 hours. Urine analysis: No results found for: COLORURINE, APPEARANCEUR, LABSPEC, PHURINE, GLUCOSEU, HGBUR, BILIRUBINUR, KETONESUR, PROTEINUR, UROBILINOGEN, NITRITE, LEUKOCYTESUR Sepsis Labs: @LABRCNTIP (procalcitonin:4,lacticidven:4)  )No results found for this or any previous visit (from the past 240 hour(s)).       Radiology Studies: Dg Chest Port 1 View  Result Date: 07/05/2018 CLINICAL DATA:  Congestive heart failure. EXAM: PORTABLE CHEST 1 VIEW COMPARISON:  July 05, 2018 FINDINGS: Bibasilar airspace opacities are again noted. The heart size is stable from prior study. There is no pneumothorax. No large pleural effusion. There are small bilateral pleural effusions. Mild aortic calcifications are noted. There is no acute osseous  abnormality. IMPRESSION: Again noted are findings consistent with pulmonary edema as previously described. No significant oval change from x-ray earlier today. Electronically Signed   By: Constance Holster M.D.   On: 07/05/2018 20:13        Scheduled Meds: . aspirin EC  81 mg Oral Daily  . atorvastatin  80 mg Oral q1800  . carvedilol  25 mg Oral BID WC  . hydrALAZINE  50 mg Oral Q8H  . insulin aspart  0-15 Units Subcutaneous Q4H  . insulin glargine  10 Units Subcutaneous Daily  . isosorbide mononitrate  30 mg Oral Daily  . levothyroxine  25 mcg Oral Q0600  . pantoprazole  40 mg Oral Daily  . sodium chloride  flush  3 mL Intravenous Q12H   Continuous Infusions: . sodium chloride    . heparin 1,450 Units/hr (07/06/18 1000)     LOS: 1 day   The patient is critically ill with multiple organ systems failure and requires high complexity decision making for assessment and support, frequent evaluation and titration of therapies, application of advanced monitoring technologies and extensive interpretation of multiple databases. Critical Care Time devoted to patient care services described in this note  Time spent: 40 minutes     , Geraldo Docker, MD [facility] Pager [phone]  If 7PM-7AM, please contact night-coverage [email or web address] Password [facility] 07/06/2018, 5:29 PM

## 2018-07-06 NOTE — Progress Notes (Signed)
ANTICOAGULATION CONSULT NOTE - Follow Up Consult  Pharmacy Consult for heparin Indication: chest pain/ACS; hx potential LV thrombus on Eliquis PTA  Allergies  Allergen Reactions  . Zetia [Ezetimibe] Diarrhea  . Lisinopril Cough    Patient Measurements: Height: 5\' 11"  (180.3 cm) Weight: (!) 300 lb 12.8 oz (136.4 kg) IBW/kg (Calculated) : 75.3 Heparin Dosing Weight: 106 kg  Vital Signs: Temp: 97.9 F (36.6 C) (06/27 1454) Temp Source: Oral (06/27 1454) BP: 168/82 (06/27 1626) Pulse Rate: 77 (06/27 1626)  Labs: Recent Labs    07/05/18 2006 07/05/18 2008 07/06/18 0705 07/06/18 1256 07/06/18 1613  HGB 13.6  --  12.5*  --   --   HCT 41.3  --  38.0*  --   --   PLT 254  --  231  --   --   APTT 41*  --  39*  --  45*  LABPROT 14.4  --   --   --   --   INR 1.1  --   --   --   --   HEPARINUNFRC  --  1.84* 1.03*  --   --   CREATININE 2.14*  --  2.65*  --   --   TROPONINIHS 531*  454*  --  316* 427*  --     Estimated Creatinine Clearance: 37.6 mL/min (A) (by C-G formula based on SCr of 2.65 mg/dL (H)).   Medications:  Infusions:  . sodium chloride    . heparin 1,450 Units/hr (07/06/18 1000)    Assessment: 62 YOM transferred from [facility] for management of ACS/CP. The patient was already started on Heparin prior to transfer.  Of note, the patient was on apixaban PTA. Per chart review - it appears to be for "substrate for potential LV thrombus" on previous echo. Per cardiology note 6/18, getting new echo and to decide on need for anticoagulation based on repeat echo. The patient did take his apixaban dose 6/26 at 0730.   Heparin level is falsely elevated by recent apixaban use at 1.03.  aPTT is subtherapeutic at 45 sec on 1450 units/hr. No bleeding noted, CBC normal on 6/26. Will monitor heparin drip by aPTT until heparin level and aPTT correlate.   Goal of Therapy:  Heparin level 0.3-0.7 units/ml aPTT 66-102 seconds Monitor platelets by anticoagulation  protocol: Yes   Plan:  Increase heparin drip to 1700 units/hr 6 hr aPTT Daily heparin level, aPTT, CBC Monitor for s/sx of bleeding F/U repeat echo and decision made re: continued anticoagulation   Thank you for involving pharmacy in this patient's care.  [employer], Pharm.D., BCPS Clinical Pharmacist  **Pharmacist phone directory can now be found on [email or web address] ([email or web address]).  Listed under [place].  07/06/2018 5:28 PM

## 2018-07-07 ENCOUNTER — Inpatient Hospital Stay (HOSPITAL_COMMUNITY): Payer: Medicare HMO

## 2018-07-07 DIAGNOSIS — N179 Acute kidney failure, unspecified: Secondary | ICD-10-CM

## 2018-07-07 DIAGNOSIS — E1165 Type 2 diabetes mellitus with hyperglycemia: Secondary | ICD-10-CM

## 2018-07-07 DIAGNOSIS — R7989 Other specified abnormal findings of blood chemistry: Secondary | ICD-10-CM

## 2018-07-07 DIAGNOSIS — I5041 Acute combined systolic (congestive) and diastolic (congestive) heart failure: Secondary | ICD-10-CM

## 2018-07-07 DIAGNOSIS — I5023 Acute on chronic systolic (congestive) heart failure: Secondary | ICD-10-CM

## 2018-07-07 DIAGNOSIS — J810 Acute pulmonary edema: Secondary | ICD-10-CM

## 2018-07-07 DIAGNOSIS — N184 Chronic kidney disease, stage 4 (severe): Secondary | ICD-10-CM

## 2018-07-07 DIAGNOSIS — I248 Other forms of acute ischemic heart disease: Secondary | ICD-10-CM

## 2018-07-07 LAB — GLUCOSE, CAPILLARY
Glucose-Capillary: 191 mg/dL — ABNORMAL HIGH (ref 70–99)
Glucose-Capillary: 205 mg/dL — ABNORMAL HIGH (ref 70–99)
Glucose-Capillary: 209 mg/dL — ABNORMAL HIGH (ref 70–99)
Glucose-Capillary: 243 mg/dL — ABNORMAL HIGH (ref 70–99)
Glucose-Capillary: 246 mg/dL — ABNORMAL HIGH (ref 70–99)
Glucose-Capillary: 276 mg/dL — ABNORMAL HIGH (ref 70–99)

## 2018-07-07 LAB — BASIC METABOLIC PANEL
Anion gap: 12 (ref 5–15)
BUN: 65 mg/dL — ABNORMAL HIGH (ref 8–23)
CO2: 21 mmol/L — ABNORMAL LOW (ref 22–32)
Calcium: 8.3 mg/dL — ABNORMAL LOW (ref 8.9–10.3)
Chloride: 100 mmol/L (ref 98–111)
Creatinine, Ser: 4.42 mg/dL — ABNORMAL HIGH (ref 0.61–1.24)
GFR calc Af Amer: 15 mL/min — ABNORMAL LOW (ref >60)
GFR calc non Af Amer: 13 mL/min — ABNORMAL LOW (ref >60)
Glucose, Bld: 214 mg/dL — ABNORMAL HIGH (ref 70–99)
Potassium: 4.4 mmol/L (ref 3.5–5.1)
Sodium: 133 mmol/L — ABNORMAL LOW (ref 135–145)

## 2018-07-07 LAB — APTT
aPTT: 60 seconds — ABNORMAL HIGH (ref 24–36)
aPTT: 48 seconds — ABNORMAL HIGH (ref 24–36)

## 2018-07-07 LAB — CBC
HCT: 32.6 % — ABNORMAL LOW (ref 39.0–52.0)
Hemoglobin: 10.8 g/dL — ABNORMAL LOW (ref 13.0–17.0)
MCH: 29.6 pg (ref 26.0–34.0)
MCHC: 33.1 g/dL (ref 30.0–36.0)
MCV: 89.3 fL (ref 80.0–100.0)
Platelets: 201 10*3/uL (ref 150–400)
RBC: 3.65 MIL/uL — ABNORMAL LOW (ref 4.22–5.81)
RDW: 14.6 % (ref 11.5–15.5)
WBC: 12.1 10*3/uL — ABNORMAL HIGH (ref 4.0–10.5)
nRBC: 0 % (ref 0.0–0.2)

## 2018-07-07 LAB — MAGNESIUM: Magnesium: 1.7 mg/dL (ref 1.7–2.4)

## 2018-07-07 LAB — HEPARIN LEVEL (UNFRACTIONATED): Heparin Unfractionated: 0.53 IU/mL (ref 0.30–0.70)

## 2018-07-07 MED ORDER — INSULIN GLARGINE 100 UNIT/ML ~~LOC~~ SOLN
20.0000 [IU] | Freq: Two times a day (BID) | SUBCUTANEOUS | Status: DC
  Administered 2018-07-07 – 2018-07-10 (×6): 20 [IU] via SUBCUTANEOUS
  Filled 2018-07-07 (×7): qty 0.2

## 2018-07-07 MED ORDER — RIVAROXABAN 2.5 MG PO TABS
2.5000 mg | ORAL_TABLET | Freq: Two times a day (BID) | ORAL | Status: DC
  Administered 2018-07-07 – 2018-07-08 (×4): 2.5 mg via ORAL
  Filled 2018-07-07 (×5): qty 1

## 2018-07-07 MED ORDER — INSULIN ASPART 100 UNIT/ML ~~LOC~~ SOLN
8.0000 [IU] | Freq: Three times a day (TID) | SUBCUTANEOUS | Status: DC
  Administered 2018-07-07 – 2018-07-10 (×8): 8 [IU] via SUBCUTANEOUS

## 2018-07-07 NOTE — Progress Notes (Signed)
ABI's have been completed. Preliminary results can be found in CV Proc through chart review.   07/07/18 11:14 AM Adam Carney RVT

## 2018-07-07 NOTE — Progress Notes (Signed)
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  Reportedly acutely hypoxic in the field.   Initially presented to [facility].  Chest x-ray reportedly with pulmonary congestion. CTA chest negative for PE but vascular congestion Troponin trended up from 0.32-0.62.  Was given full dose aspirin and started on heparin drip.  Required up to 6 L by nasal cannula at [facility] but weaned down quickly after Nitropaste and Lasix.  Transferred to [facility] for end STEMI and CHF exacerbation.  Subjective: No major events overnight other than lack of sleep due to "a lot of visitors".  Denies chest pain, shortness of breath, orthopnea, GI or GU symptoms.  Reports good urine output with diuretics.  Complains about some cough from his sinus drainage.  Objective: Vitals:   07/06/18 1454 07/06/18 1626 07/06/18 2034 07/07/18 0447  BP: (!) 125/57 (!) 168/82 (!) 121/41 (!) 142/68  Pulse: 73 77 72 67  Resp:   18 18  Temp: 97.9 F (36.6 C)  (!) 97.4 F (36.3 C) 98 F (36.7 C)  TempSrc: Oral  Oral Oral  SpO2: 96%  100% 96%  Weight:    (!) 138.7 kg  Height:        Intake/Output Summary (Last 24 hours) at 07/07/2018 0754 Last data filed at 07/07/2018 0511 Gross per 24 hour  Intake 723 ml  Output 750 ml  Net -27 ml   Filed Weights   07/05/18 1900 07/06/18 0526 07/07/18 0447  Weight: 133.2 kg (!) 136.4 kg (!) 138.7 kg    Examination:  GENERAL: No acute distress.  Appears well.  HEENT: MMM.  Vision and hearing grossly intact.  NECK: Supple.  Difficult to assess JVD due to body habitus. LUNGS:  No IWOB.   Fair air movement bilaterally.  Difficult exam due to body habitus. HEART:  RRR. Heart sounds normal.  ABD: Bowel sounds present. Soft. Non tender.  MSK/EXT:  Moves all extremities. No apparent deformity. No edema bilaterally.  SKIN: no apparent skin lesion or wound NEURO: Awake, alert and oriented appropriately.  No gross deficit.  PSYCH: Calm. Normal affect.   I have personally reviewed the following labs and images: CBC: Recent Labs  Lab 07/05/18 2006 07/06/18 0705  WBC 10.8* 14.9*  NEUTROABS 9.6*  --   HGB 13.6 12.5*  HCT 41.3 38.0*  MCV 88.8 90.3  PLT 254 803   Basic Metabolic Panel: Recent Labs  Lab 07/05/18 2006 07/06/18 0705  NA 137 135  K 4.5 4.6  CL 105 100  CO2 22 19*  GLUCOSE 240* 315*  BUN 29* 38*  CREATININE 2.14* 2.65*  CALCIUM 8.9 8.7*  MG 1.7  --    GFR: Estimated Creatinine Clearance: 38 mL/min (A) (by C-G formula based on SCr of 2.65 mg/dL (H)). Liver Function Tests: Recent Labs  Lab 07/03/18 0813 07/05/18 2006  AST 28 27  ALT 24 28  ALKPHOS  --  104  BILITOT  --  0.7  PROT  --  6.6  ALBUMIN  --  3.0*   No results for input(s): LIPASE, AMYLASE in  the last 168 hours. No results for input(s): AMMONIA in the last 168 hours. Coagulation Profile: Recent Labs  Lab 07/05/18 2006  INR 1.1   Cardiac Enzymes: No results for input(s): CKTOTAL, CKMB, CKMBINDEX, TROPONINI in the last 168 hours. BNP (last 3 results) No results for input(s): PROBNP in the last 8760 hours. HbA1C: Recent Labs    07/05/18 2006  HGBA1C 8.4*   CBG: Recent Labs  Lab 07/06/18 1147 07/06/18 1622 07/06/18 2032 07/07/18 0010 07/07/18 0432  GLUCAP 331* 259* 273* 243* 246*   Lipid Profile: No results for input(s): CHOL, HDL, LDLCALC, TRIG, CHOLHDL, LDLDIRECT in the last 72 hours. Thyroid Function Tests: Recent Labs    07/05/18 2006  TSH 1.476   Anemia Panel: No results for input(s): VITAMINB12, FOLATE, FERRITIN, TIBC, IRON, RETICCTPCT in the last 72  hours. Urine analysis: No results found for: COLORURINE, APPEARANCEUR, LABSPEC, PHURINE, GLUCOSEU, HGBUR, BILIRUBINUR, KETONESUR, PROTEINUR, UROBILINOGEN, NITRITE, LEUKOCYTESUR Sepsis Labs: Invalid input(s): PROCALCITONIN, LACTICIDVEN  No results found for this or any previous visit (from the past 240 hour(s)).   Radiology Studies: No results found.  6/27: CTA chest at [facility] Cardiovascular: Mild cardiomegaly.  No pericardial effusion.  Dilated central pulmonary arteries suggesting pulmonary artery hypertension.  No evidence of pulmonary emboli. Impression: 1.  Negative for acute PE or thoracic aortic dissection. 2 tiny bilateral pleural effusions. 3 bilateral pulmonary airspace and Groundglass opacities favor edema/alveolitis over pneumonia 4.  Coronary and aortic atherosclerosis 5.  Dilated central pulmonary arteries suggesting pulmonary arterial hypertension.  Procedures:  6/27: Echocardiogram with EF of 45%, moderate concentric LVH, diastolic dysfunction and no other significant structural functional abnormalities.  Microbiology: None  Assessment & Plan: Acute hypoxemic respiratory failure: Likely due to CHF exacerbation as below.  PE excluded by CTA chest.  Resolved. -Manage CHF as below.  Acute on chronic combined CHF: presented with acute dyspnea, diaphoresis and hypoxemia. Likely due to dietary indiscretion. Echo as above.  Symptoms improved with IV Lasix.  Intake output not well captured in epic.  Weight up by 5 pounds but not reliable.  Labs from this morning pending. -Cardiology managing -Diuretics per cardiology -Continue Coreg 25 mg twice daily, BiDil -Daily weight, intake output and renal functions -Salt and fluid restrictions -Closely monitor electrolytes and replenish aggressively.  NSTEMI/history of STEMI s/p DES to LAD in 2017: troponin in gray zone.  Could be demand ischemia due to CHF.  Patient is chest pain-free.  EKG NSR with age-indeterminate infarcts.  -Cardiac meds as above -Heparin drip  -Atorvastatin -Renal function barrier to LHC  AKI on CKD-3: unclear baseline creatinine but in the range of 1.5-1.7 about 2 to 3 years ago.  Denies NSAID but on high-dose losartan at home. -Agree with holding losartan -Continue monitoring -Follow renal ultrasound and labs from this morning.  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  CBG in the range of 200-300s.  On Tresiba to 60 units and Victoza at home. -Increase Lantus to 20 twice daily -Add mealtime coverage at 8 units 3 times daily -Continue SSI-moderate -Continue statin  History of CVA: thought to be due to LV thrombus after his prior MI complicated by cardiac arrest and low EF.  On Eliquis at home.  -Eliquis on hold while on heparin drip -Cardiology recommending ASA + low dose Xarelto (COMPASS regimen) rather than apixaban alone.  -Aspirin, statin and cardiac meds   Hypothyroidism: Stable -Continue home Synthroid  Morbid obesity: BMI  42.65 -Encourage lifestyle change to lose weight -On Victoza at home  ?Vascular dementia/anoxic encephalopathy?: thought to be due to CVA.  Stable  Possible PAD -Follow TBI -Aspirin and statin as above  Chronic cough/sinus issue: -Flonase nasal spray  DVT prophylaxis: On heparin drip Code Status: Full code Family Communication: Updated patient's wife over the phone. Disposition Plan: Remains inpatient Consultants: Cardiology  Antimicrobials: Anti-infectives (From admission, onward)   None      Scheduled Meds: . aspirin EC  81 mg Oral Daily  . atorvastatin  80 mg Oral q1800  . carvedilol  25 mg Oral BID WC  . hydrALAZINE  50 mg Oral Q8H  . insulin aspart  0-15 Units Subcutaneous Q4H  . insulin glargine  10 Units Subcutaneous Daily  . isosorbide mononitrate  30 mg Oral Daily  . levothyroxine  25 mcg Oral Q0600  . pantoprazole  40 mg Oral  Daily  . sodium chloride flush  3 mL Intravenous Q12H   Continuous Infusions: . sodium chloride    . heparin 1,900 Units/hr (07/07/18 0240)   PRN Meds:.sodium chloride, acetaminophen, nitroGLYCERIN, ondansetron (ZOFRAN) IV, sodium chloride flush  Nomi Rudnicki T. Jeddito  If 7PM-7AM, please contact night-coverage [email or web address] Password [facility] 07/07/2018, 7:54 AM

## 2018-07-07 NOTE — Progress Notes (Signed)
Patient ID: Adam Carney, male   DOB: Jan 19, 1950, 69 y.o.   MRN: [id number]      PCP-Cardiologist: Jenne Campus, MD   Subjective:    No complaints this morning, no dyspnea or chest pain.   He reports a diet high in sodium and drinks "at least 2 gallons" of water a day.   BP appears to be better controlled.   Though labs were ordered, BMET unfortunately has not yet been done today.   - Echo: EF 45% with peri-apical akinesis, moderate LVH.  - Renal artery doppler US: Mild bilateral RAS.   Objective:   Weight Range: (!) 138.7 kg Body mass index is 42.65 kg/m.   Vital Signs:   Temp:  [97.4 F (36.3 C)-98 F (36.7 C)] 98 F (36.7 C) (06/28 0447) Pulse Rate:  [67-77] 67 (06/28 0447) Resp:  [18] 18 (06/28 0447) BP: (121-168)/(41-82) 142/68 (06/28 0447) SpO2:  [96 %-100 %] 96 % (06/28 0447) Weight:  [138.7 kg] 138.7 kg (06/28 0447) Last BM Date: 07/04/18  Weight change: Filed Weights   07/05/18 1900 07/06/18 0526 07/07/18 0447  Weight: 133.2 kg (!) 136.4 kg (!) 138.7 kg    Intake/Output:   Intake/Output Summary (Last 24 hours) at 07/07/2018 1037 Last data filed at 07/07/2018 0511 Gross per 24 hour  Intake 483 ml  Output 750 ml  Net -267 ml      Physical Exam    General:  Well appearing. No resp difficulty HEENT: Normal Neck: Supple. JVP not elevated. Carotids 2+ bilat; no bruits. No lymphadenopathy or thyromegaly appreciated. Cor: PMI nondisplaced. Regular rate & rhythm. No rubs, gallops or murmurs. Lungs: Clear Abdomen: Soft, nontender, nondistended. No hepatosplenomegaly. No bruits or masses. Good bowel sounds. Extremities: No cyanosis, clubbing, rash. 1+ ankle edema.  Neuro: Alert & orientedx3, cranial nerves grossly intact. moves all 4 extremities w/o difficulty. Affect pleasant   Telemetry   NSR in 70s (personally reviewed)  Labs    CBC Recent Labs    07/05/18 2006 07/06/18 0705  WBC 10.8* 14.9*  NEUTROABS 9.6*  --   HGB 13.6 12.5*  HCT 41.3  38.0*  MCV 88.8 90.3  PLT 254 841   Basic Metabolic Panel Recent Labs    07/05/18 2006 07/06/18 0705  NA 137 135  K 4.5 4.6  CL 105 100  CO2 22 19*  GLUCOSE 240* 315*  BUN 29* 38*  CREATININE 2.14* 2.65*  CALCIUM 8.9 8.7*  MG 1.7  --    Liver Function Tests Recent Labs    07/05/18 2006  AST 27  ALT 28  ALKPHOS 104  BILITOT 0.7  PROT 6.6  ALBUMIN 3.0*   No results for input(s): LIPASE, AMYLASE in the last 72 hours. Cardiac Enzymes No results for input(s): CKTOTAL, CKMB, CKMBINDEX, TROPONINI in the last 72 hours.  BNP: BNP (last 3 results) Recent Labs    07/05/18 2006  BNP 546.7*    ProBNP (last 3 results) No results for input(s): PROBNP in the last 8760 hours.   D-Dimer No results for input(s): DDIMER in the last 72 hours. Hemoglobin A1C Recent Labs    07/05/18 2006  HGBA1C 8.4*   Fasting Lipid Panel No results for input(s): CHOL, HDL, LDLCALC, TRIG, CHOLHDL, LDLDIRECT in the last 72 hours. Thyroid Function Tests Recent Labs    07/05/18 2006  TSH 1.476    Other results:   Imaging    Vas US Renal Artery Duplex  Result Date: 07/07/2018 ABDOMINAL VISCERAL Indications: Flash pulmonary edema  High Risk Factors: Hypertension, Diabetes. Limitations: Obesity and air/bowel gas. Comparison Study: No prior studies. Performing Technologist: Oliver Hum RVT  Examination Guidelines: A complete evaluation includes B-mode imaging, spectral Doppler, color Doppler, and power Doppler as needed of all accessible portions of each vessel. Bilateral testing is considered an integral part of a complete examination. Limited examinations for reoccurring indications may be performed as noted.  Duplex Findings: +--------------------+--------+--------+------+-------------------+  Mesenteric           PSV cm/s EDV cm/s Plaque      Comments        +--------------------+--------+--------+------+-------------------+  Aorta Distal           136       22                                 +--------------------+--------+--------+------+-------------------+  Celiac Artery Origin                          Unable to visualize  +--------------------+--------+--------+------+-------------------+  SMA Origin                                    Unable to visualize  +--------------------+--------+--------+------+-------------------+  +------------------+--------+--------+-------+  Right Renal Artery PSV cm/s EDV cm/s Comment  +------------------+--------+--------+-------+  Origin                90       12             +------------------+--------+--------+-------+  Proximal             114       14             +------------------+--------+--------+-------+  Mid                   79       10             +------------------+--------+--------+-------+  Distal                72       12             +------------------+--------+--------+-------+ +-----------------+--------+--------+-------+  Left Renal Artery PSV cm/s EDV cm/s Comment  +-----------------+--------+--------+-------+  Origin               36       7              +-----------------+--------+--------+-------+  Proximal             23       8              +-----------------+--------+--------+-------+  Mid                  26       10             +-----------------+--------+--------+-------+  Distal               25       5              +-----------------+--------+--------+-------+ Technologist observations Renal Artery(s):There is a 4cm length disparity between the right and left kidney. +------------+--------+--------+----+-----------+--------+--------+----+  Right Kidney PSV cm/s EDV cm/s RI   Left Kidney PSV cm/s EDV cm/s RI    +------------+--------+--------+----+-----------+--------+--------+----+  Upper  Pole   31       6        0.79 Upper Pole  20       6        0.70  +------------+--------+--------+----+-----------+--------+--------+----+  Mid          29       7        0.74 Mid         19       7        0.63   +------------+--------+--------+----+-----------+--------+--------+----+  Lower Pole   24       3        0.86 Lower Pole  19       5        0.72  +------------+--------+--------+----+-----------+--------+--------+----+  Hilar        76       12       0.84 Hilar       25       7        0.72  +------------+--------+--------+----+-----------+--------+--------+----+ +------------------+-----+------------------+-----+  Right Kidney             Left Kidney               +------------------+-----+------------------+-----+  RAR                      RAR                       +------------------+-----+------------------+-----+  RAR (manual)       0.84  RAR (manual)       0.26   +------------------+-----+------------------+-----+  Cortex                   Cortex                    +------------------+-----+------------------+-----+  Cortex thickness         Corex thickness           +------------------+-----+------------------+-----+  Kidney length (cm) 15.00 Kidney length (cm) 11.00  +------------------+-----+------------------+-----+   Summary: Renal:  Right: Normal right Resisitive Index. 1-59% stenosis of the right        renal artery. Left:  Normal left Resistive Index. 1-59% stenosis of the left renal        artery.  *See table(s) above for measurements and observations.     Preliminary       Medications:     Scheduled Medications:  aspirin EC  81 mg Oral Daily   atorvastatin  80 mg Oral q1800   carvedilol  25 mg Oral BID WC   hydrALAZINE  50 mg Oral Q8H   insulin aspart  0-15 Units Subcutaneous Q4H   insulin aspart  8 Units Subcutaneous TID WC   insulin glargine  20 Units Subcutaneous BID   isosorbide mononitrate  30 mg Oral Daily   levothyroxine  25 mcg Oral Q0600   pantoprazole  40 mg Oral Daily   rivaroxaban  2.5 mg Oral BID   sodium chloride flush  3 mL Intravenous Q12H     Infusions:  sodium chloride       PRN Medications:  sodium chloride, acetaminophen, nitroGLYCERIN,  ondansetron (ZOFRAN) IV, sodium chloride flush    Assessment/Plan   1. Acute on chronic systolic CHF: Patient presented with episode of what sounds like flash pulmonary edema.  He has rapidly improved, no dyspnea since time of  admission.  Echo in 2018 showed EF 45-50%. Ischemic cardiomyopathy.  Echo this admission showed moderate LVH with EF 45%, similar to prior.  There is peri-apical akinesis consistent with known prior anterior MI.   It is possible that he had a hypertensive emergency with flash pulmonary edema (BP initially high here, not sure what it was in the ER at [facility]). Ischemia-triggered flash pulmonary edema is also a consideration, he has known CAD.  However, TnI has been only mildly elevated with no significant trend.  Renal artery dopplers showed only mild renal artery stenosis.  No chest pain.  On exam, he actually does not look volume overloaded currently and creatinine was up yesterday.  Still waiting for BMET this morning.   - Continue Coreg 25 mg bid.  - Stopped losartan with AKI.  - Continue hydralazine 50 mg tid and increase Imdur to 60 mg daily.  - Restart home Lasix 60 mg daily if creatinine comes back stable to decreased today.  - We had a long discussion about cutting back on sodium and fluid.  2. CAD: Patient had anterior MI in 2017 with LAD PCI.  This was complicated by cardiac arrest and anoxic injury. No chest pain.  His flash pulmonary edema event could have been ischemia-mediated.   The troponin is mildly elevated but there is no significant trend, suspect due to demand ischemia from CHF/volume overload and renal dysfunction. Echo showed stable EF 45% with peri-apical akinesis consistent with prior anterior MI.   - Continue ASA 81 mg daily for now.  - Think we can stop heparin gtt today.  - Continue atorvastatin.  - Ideally would have coronary angiography to rule out new coronary disease.  However, in absence of definite ACS with chest pain and elevated creatinine  as well as stable EF, would hold off for now. Will reassess if creatinine trends dowon significantly or he develops significant chest pain.  3. AKI on CKD stage 3: His wife states that he has had CKD stage 3 for 3-4 years.  Uncertain what his creatinine baseline is.  Creatinine up from 2.14 => 2.65 but no BMET today.  Possible component of contrast nephropathy as he had a chest CTA.  Now looks euvolemic.  - Stopped losartan.  - Lasix on hold pending today's BMET.  If creatinine unchanged or lower, would restart home Lasix 60 mg daily.  4. H/o CVA: At time of prior MI (complicated by cardiac arrest, CPR, prolonged hospitalization).  He was anticoagulated due to low EF at the time and "substrate for LV thrombus" though it appears LV thrombus was never identified.  He has been on Eliquis at home.  - He is on heparin gtt here.  - As EF is now up to 45%, may be better to have him on ASA + low dose Xarelto (COMPASS regimen) rather than apixaban alone.  Atrial fibrillation has never been identified. Ordered today.  5. H/o anoxic encephalopathy: Has memory trouble.  6. Leg pain: Difficult to palpate pulses, ?PAD.  Have ordered peripheral arterial dopplers.  Length of Stay: 2  Loralie Champagne, MD  07/07/2018, 10:37 AM  Advanced Heart Failure Team Pager [phone] (M-F; 7a - 4p)  Please contact [facility] for night-coverage after hours (4p -7a ) and weekends on [email or web address]

## 2018-07-07 NOTE — Progress Notes (Signed)
ANTICOAGULATION CONSULT NOTE - Follow Up Consult  Pharmacy Consult for heparin Indication: r/o ACS and h/o possible LV thrombus  Labs: Recent Labs    07/05/18 2006 07/05/18 2008 07/06/18 0705 07/06/18 1256 07/06/18 1613 07/07/18 0007  HGB 13.6  --  12.5*  --   --   --   HCT 41.3  --  38.0*  --   --   --   PLT 254  --  231  --   --   --   APTT 41*  --  39*  --  45* 60*  LABPROT 14.4  --   --   --   --   --   INR 1.1  --   --   --   --   --   HEPARINUNFRC  --  1.84* 1.03*  --   --   --   CREATININE 2.14*  --  2.65*  --   --   --   TROPONINIHS 531*  454*  --  316* 427*  --   --     Assessment: 68yo male remains subtherapeutic on heparin though closer to goal after rate change; no gtt issues or signs of bleeding per RN.  Goal of Therapy:  aPTT 66-102 seconds   Plan:  Will increase heparin gtt by 2 units/kgABW/hr to 1900 units/hr and check PTT in 8 hours.    Wynona Neat, PharmD, BCPS  07/07/2018,2:36 AM

## 2018-07-07 NOTE — Progress Notes (Signed)
Renal artery duplex has been completed. Preliminary results can be found in CV Proc through chart review.   07/07/18 9:53 AM Adam Carney RVT

## 2018-07-08 ENCOUNTER — Telehealth: Payer: Self-pay | Admitting: Emergency Medicine

## 2018-07-08 LAB — BASIC METABOLIC PANEL
Anion gap: 10 (ref 5–15)
BUN: 76 mg/dL — ABNORMAL HIGH (ref 8–23)
CO2: 21 mmol/L — ABNORMAL LOW (ref 22–32)
Calcium: 8 mg/dL — ABNORMAL LOW (ref 8.9–10.3)
Chloride: 103 mmol/L (ref 98–111)
Creatinine, Ser: 4.58 mg/dL — ABNORMAL HIGH (ref 0.61–1.24)
GFR calc Af Amer: 14 mL/min — ABNORMAL LOW (ref >60)
GFR calc non Af Amer: 12 mL/min — ABNORMAL LOW (ref >60)
Glucose, Bld: 167 mg/dL — ABNORMAL HIGH (ref 70–99)
Potassium: 4.4 mmol/L (ref 3.5–5.1)
Sodium: 134 mmol/L — ABNORMAL LOW (ref 135–145)

## 2018-07-08 LAB — GLUCOSE, CAPILLARY
Glucose-Capillary: 150 mg/dL — ABNORMAL HIGH (ref 70–99)
Glucose-Capillary: 162 mg/dL — ABNORMAL HIGH (ref 70–99)
Glucose-Capillary: 163 mg/dL — ABNORMAL HIGH (ref 70–99)
Glucose-Capillary: 174 mg/dL — ABNORMAL HIGH (ref 70–99)
Glucose-Capillary: 176 mg/dL — ABNORMAL HIGH (ref 70–99)
Glucose-Capillary: 211 mg/dL — ABNORMAL HIGH (ref 70–99)

## 2018-07-08 LAB — CBC
HCT: 32 % — ABNORMAL LOW (ref 39.0–52.0)
Hemoglobin: 10.6 g/dL — ABNORMAL LOW (ref 13.0–17.0)
MCH: 29 pg (ref 26.0–34.0)
MCHC: 33.1 g/dL (ref 30.0–36.0)
MCV: 87.4 fL (ref 80.0–100.0)
Platelets: 196 10*3/uL (ref 150–400)
RBC: 3.66 MIL/uL — ABNORMAL LOW (ref 4.22–5.81)
RDW: 14.4 % (ref 11.5–15.5)
WBC: 10.7 10*3/uL — ABNORMAL HIGH (ref 4.0–10.5)
nRBC: 0 % (ref 0.0–0.2)

## 2018-07-08 LAB — MAGNESIUM: Magnesium: 1.8 mg/dL (ref 1.7–2.4)

## 2018-07-08 MED ORDER — INSULIN ASPART 100 UNIT/ML ~~LOC~~ SOLN
0.0000 [IU] | Freq: Three times a day (TID) | SUBCUTANEOUS | Status: DC
  Administered 2018-07-09 (×2): 3 [IU] via SUBCUTANEOUS
  Administered 2018-07-09: 8 [IU] via SUBCUTANEOUS
  Administered 2018-07-10: 08:00:00 2 [IU] via SUBCUTANEOUS

## 2018-07-08 NOTE — Telephone Encounter (Signed)
Patient's wife informed of lab results. She reports patient is is currently hospitalized at [facility], she states he had a heart attack on Friday. I will inform Dr. Fraser Din.

## 2018-07-08 NOTE — Telephone Encounter (Signed)
Left message for patient to return call regarding results  

## 2018-07-08 NOTE — TOC Benefit Eligibility Note (Signed)
Transition of Care [facility]) Benefit Eligibility Note    Patient Details  Name: Adam Carney MRN: [id number] Date of Birth: [birth year]   Medication/Dose: Alveda Reasons  2.5 MG  BID  Covered?: Yes  Tier: 3 Drug  Prescription Coverage [facility]erred Pharmacy: [facility]  Spoke with Person/Company/Phone Number:: LOTRICE  @ [facility] HT # [phone]  Co-Pay: $119.23  Prior Approval: No  Deductible: Unmet  Additional Notes: COVERAGE Gwenith Spitz Phone Number: 07/08/2018, 10:12 AM

## 2018-07-08 NOTE — Care Management Important Message (Signed)
Important Message  Patient Details  Name: Adam Carney MRN: [id number] Date of Birth: 1950-11-20   Medicare Important Message Given:  Yes     [name] Montine Circle 07/08/2018, 4:47 PM

## 2018-07-08 NOTE — Progress Notes (Signed)
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  Reportedly acutely hypoxic in the field.   Initially presented to [facility].  Chest x-ray reportedly with pulmonary congestion. CTA chest negative for PE but vascular congestion Troponin trended up from 0.32-0.62.  Was given full dose aspirin and started on heparin drip.  Required up to 6 L by nasal cannula at [facility] but weaned down quickly after Nitropaste and Lasix.  Transferred to [facility] for end STEMI and CHF exacerbation.  Patient continued on heparin drip and IV Lasix.  The later was held due to AKI.  Echocardiogram with EF of 45%, moderate concentric LVH, diastolic dysfunction and no other significant structural functional abnormalities.  Renal duplex not impressive.  ABI consistent with mild PAD.  Subjective: No major events overnight of this morning.  He denies dyspnea, chest pain, orthopnea or PND.  Denies GI or GU symptoms.  Serum creatinine plateauing.  Off Lasix and other nephrotoxic meds.  Cardiology following.  Renal duplex not impressive.  ABI with mild PAD bilaterally.  Objective: Vitals:   07/07/18 2009 07/07/18 2013 07/08/18 0500 07/08/18 0753  BP: (!) 115/44 (!) 115/44 (!) 141/73 (!) 147/83  Pulse: 67 68 72 68  Resp:  16 18   Temp: 97.9 F (36.6 C) 97.9 F (36.6 C) 99.1 F (37.3 C)   TempSrc: Oral Oral Oral   SpO2: 97% 98% 95%   Weight:   (!) 139.5 kg   Height:        Intake/Output Summary (Last 24 hours) at 07/08/2018 1544 Last data filed at 07/08/2018 0000 Gross per 24 hour  Intake 123 ml  Output 550 ml  Net  -427 ml   Filed Weights   07/06/18 0526 07/07/18 0447 07/08/18 0500  Weight: (!) 136.4 kg (!) 138.7 kg (!) 139.5 kg    Examination:  GENERAL: No acute distress.  Appears well.  Sitting on bedside chair. HEENT: MMM.  Vision and hearing grossly intact.  NECK: Supple.  No apparent JVD but difficult exam due to body habitus. LUNGS:  No IWOB.  Fair air movement bilaterally.  Bibasilar crackles. HEART:  RRR. Heart sounds normal.  ABD: Bowel sounds present. Soft. Non tender.  MSK/EXT:  Moves all extremities. No apparent deformity.  Trace edema bilaterally. SKIN: no apparent skin lesion or wound NEURO: Awake, alert and oriented appropriately.  No gross deficit.  PSYCH: Calm. Normal affect.  I have personally reviewed the following labs and images: CBC: Recent Labs  Lab 07/05/18 2006 07/06/18 0705 07/07/18 1105 07/08/18 0614  WBC 10.8* 14.9* 12.1* 10.7*  NEUTROABS 9.6*  --   --   --   HGB 13.6 12.5* 10.8* 10.6*  HCT 41.3 38.0* 32.6* 32.0*  MCV 88.8 90.3 89.3 87.4  PLT 254 231 201 163   Basic Metabolic Panel: Recent Labs  Lab 07/05/18 2006 07/06/18 0705 07/07/18 1105 07/07/18 1219 07/08/18 0614  NA 137 135  --  133* 134*  K 4.5 4.6  --  4.4 4.4  CL 105 100  --  100 103  CO2 22 19*  --  21* 21*  GLUCOSE 240* 315*  --  214* 167*  BUN 29* 38*  --  65* 76*  CREATININE 2.14* 2.65*  --  4.42* 4.58*  CALCIUM 8.9 8.7*  --  8.3* 8.0*  MG 1.7  --  1.7  --  1.8   GFR: Estimated Creatinine Clearance: 22.1 mL/min (A) (by C-G formula based on SCr of 4.58 mg/dL (H)). Liver Function Tests: Recent Labs  Lab 07/03/18 0813 07/05/18 2006  AST 28 27  ALT 24 28  ALKPHOS  --  104  BILITOT  --  0.7  PROT  --  6.6  ALBUMIN  --  3.0*   No results for input(s): LIPASE, AMYLASE in the last 168 hours. No results for input(s): AMMONIA in the last 168 hours. Coagulation Profile: Recent Labs  Lab 07/05/18 2006  INR 1.1   Cardiac Enzymes: No results for input(s): CKTOTAL, CKMB,  CKMBINDEX, TROPONINI in the last 168 hours. BNP (last 3 results) No results for input(s): PROBNP in the last 8760 hours. HbA1C: Recent Labs    07/05/18 2006  HGBA1C 8.4*   CBG: Recent Labs  Lab 07/07/18 2011 07/08/18 0011 07/08/18 0455 07/08/18 0725 07/08/18 1135  GLUCAP 276* 211* 150* 163* 176*   Lipid Profile: No results for input(s): CHOL, HDL, LDLCALC, TRIG, CHOLHDL, LDLDIRECT in the last 72 hours. Thyroid Function Tests: Recent Labs    07/05/18 2006  TSH 1.476   Anemia Panel: No results for input(s): VITAMINB12, FOLATE, FERRITIN, TIBC, IRON, RETICCTPCT in the last 72 hours. Urine analysis: No results found for: COLORURINE, APPEARANCEUR, LABSPEC, PHURINE, GLUCOSEU, HGBUR, BILIRUBINUR, KETONESUR, PROTEINUR, UROBILINOGEN, NITRITE, LEUKOCYTESUR Sepsis Labs: Invalid input(s): PROCALCITONIN, LACTICIDVEN  No results found for this or any previous visit (from the past 240 hour(s)).   Radiology Studies: No results found.  6/27: [facility] chest at [facility] Cardiovascular: Mild cardiomegaly.  No pericardial effusion.  Dilated central pulmonary arteries suggesting pulmonary artery hypertension.  No evidence of pulmonary emboli. Impression: 1.  Negative for acute PE or thoracic aortic dissection. 2 tiny bilateral pleural effusions. 3 bilateral pulmonary airspace and Groundglass opacities favor edema/alveolitis over pneumonia 4.  Coronary and aortic atherosclerosis 5.  Dilated central pulmonary arteries suggesting pulmonary arterial hypertension.  Procedures:  6/27: Echocardiogram with EF of 45%, moderate concentric LVH, diastolic dysfunction and no other significant structural functional abnormalities.  Microbiology: None  Assessment & Plan: Acute hypoxemic respiratory failure: Likely due to CHF exacerbation as below.  PE excluded by CTA chest.  Resolved. -Manage CHF as below.  Acute on chronic combined CHF: presented with acute dyspnea, diaphoresis and hypoxemia. Likely  due to dietary indiscretion. Echo as above.  Symptoms improved with IV Lasix but had AKI.  Lasix on hold.  Urine output not captured well.  Respiratory symptoms improved. -Cardiology managing -Continue Coreg 25 mg twice daily, BiDil, low-dose Xarelto -Daily weight, intake output and renal functions -Continue salt restriction. -We will temporarily liberate his fluid intake to 2000 cc a day in the setting of AKI. -Closely monitor electrolytes and replenish aggressively.  NSTEMI/history of STEMI s/p DES to LAD in 2017: troponin in gray zone.  Could be demand ischemia due to CHF.  Patient is chest pain-free.  EKG NSR with age-indeterminate infarcts. -Cardiac meds as above -Atorvastatin -Renal function barrier to LHC  AKI on CKD-3/azotemia: unclear baseline creatinine but in the range of 1.5-1.7 about 2 to 3 years ago.  Likely a combination of contrast, ARB and Lasix.  Denies NSAID use.  Renal duplex nontoxic.  Creatinine plateauing.  No uremic symptoms. -Lasix and losartan on hold. -Liberate fluid intake -Chronic monitoring -We will consult nephrology if no improvement  Mild Anion gap metabolic acidosis: Anion gap resolved.  Likely due to the above. -Follow morning labs  Suboptimally controlled IDDM-2 with hyperglycemia renal complication: W8G 8.8%. On Tresiba to 60 units and Victoza at home.  CBG within fair range. -Lantus 20 units twice daily -NovoLog 8 units 3 times daily -Continue SSI-moderate -Continue statin  History of CVA: thought to be due to LV thrombus after his prior MI complicated by cardiac arrest and low EF.  On Eliquis at home.  -On low-dose Xarelto-plan to discharge on Eliquis -Aspirin, statin and cardiac meds   Hypothyroidism: Stable -Continue home Synthroid  Morbid obesity: BMI 42.65 -Encourage lifestyle change to lose weight -On Victoza at home  ?Vascular dementia/anoxic encephalopathy?: thought to be due to CVA.  Stable  Mild PAD: ABI and TBI showed mild PAD.  -Aspirin and statin as above  Chronic cough/sinus issue: -Flonase nasal spray  DVT prophylaxis: On heparin drip Code Status: Full code Family Communication: Updated patient's wife over the phone on 6/29 Disposition Plan: Remains inpatient for significant AKI, and STEMI and CHF exacerbation. Consultants: Cardiology  Antimicrobials: Anti-infectives (From admission, onward)   None      Scheduled Meds: . aspirin EC  81 mg Oral Daily  . atorvastatin  80 mg Oral q1800  . carvedilol  25 mg Oral BID WC  . hydrALAZINE  50 mg Oral Q8H  . insulin aspart  0-15 Units Subcutaneous Q4H  . insulin aspart  8 Units Subcutaneous TID WC  . insulin glargine  20 Units Subcutaneous BID  . isosorbide mononitrate  30 mg Oral Daily  . levothyroxine  25 mcg Oral Q0600  . pantoprazole  40 mg Oral Daily  . rivaroxaban  2.5 mg Oral BID  . sodium chloride flush  3 mL Intravenous Q12H   Continuous Infusions: . sodium chloride     PRN Meds:.sodium chloride, acetaminophen, nitroGLYCERIN, ondansetron (ZOFRAN) IV, sodium chloride flush  Elizabeth Paulsen T. Mora  If 7PM-7AM, please contact night-coverage [email or web address] Password [email or web address] 07/08/2018, 3:44 PM

## 2018-07-08 NOTE — Progress Notes (Addendum)
Progress Note  Patient Name: Adam Carney Date of Encounter: 07/08/2018  Primary Cardiologist: Jenne Campus, MD   Subjective   Feeling well this morning. Breathing is stable.   Inpatient Medications    Scheduled Meds:  aspirin EC  81 mg Oral Daily   atorvastatin  80 mg Oral q1800   carvedilol  25 mg Oral BID WC   hydrALAZINE  50 mg Oral Q8H   insulin aspart  0-15 Units Subcutaneous Q4H   insulin aspart  8 Units Subcutaneous TID WC   insulin glargine  20 Units Subcutaneous BID   isosorbide mononitrate  30 mg Oral Daily   levothyroxine  25 mcg Oral Q0600   pantoprazole  40 mg Oral Daily   rivaroxaban  2.5 mg Oral BID   sodium chloride flush  3 mL Intravenous Q12H   Continuous Infusions:  sodium chloride     PRN Meds: sodium chloride, acetaminophen, nitroGLYCERIN, ondansetron (ZOFRAN) IV, sodium chloride flush   Vital Signs    Vitals:   07/07/18 2009 07/07/18 2013 07/08/18 0500 07/08/18 0753  BP: (!) 115/44 (!) 115/44 (!) 141/73 (!) 147/83  Pulse: 67 68 72 68  Resp:  16 18   Temp: 97.9 F (36.6 C) 97.9 F (36.6 C) 99.1 F (37.3 C)   TempSrc: Oral Oral Oral   SpO2: 97% 98% 95%   Weight:   (!) 139.5 kg   Height:        Intake/Output Summary (Last 24 hours) at 07/08/2018 0817 Last data filed at 07/08/2018 0000 Gross per 24 hour  Intake 603 ml  Output 550 ml  Net 53 ml   Last 3 Weights 07/08/2018 07/07/2018 07/06/2018  Weight (lbs) 307 lb 9.6 oz 305 lb 12.8 oz 300 lb 12.8 oz  Weight (kg) 139.526 kg 138.71 kg 136.442 kg      Telemetry    SR rate 60s - Personally Reviewed  ECG    No tracing this morning - Personally Reviewed  Physical Exam  Older very pleasant WM GEN: No acute distress.   Neck: No JVD Cardiac: RRR, no murmurs, rubs, or gallops.  Respiratory: Clear to auscultation bilaterally. GI: Soft, nontender, non-distended  MS: 1+ LE bilateral edema; No deformity. Neuro:  Nonfocal  Psych: Normal affect   Labs    High  Sensitivity Troponin:   Recent Labs  Lab 07/05/18 2006 07/06/18 0705 07/06/18 1256  TROPONINIHS 531*   454* 316* 427*      Cardiac EnzymesNo results for input(s): TROPONINI in the last 168 hours. No results for input(s): TROPIPOC in the last 168 hours.   Chemistry Recent Labs  Lab 07/03/18 0813  07/05/18 2006 07/06/18 0705 07/07/18 1219 07/08/18 0614  NA  --    < > 137 135 133* 134*  K  --    < > 4.5 4.6 4.4 4.4  CL  --    < > 105 100 100 103  CO2  --    < > 22 19* 21* 21*  GLUCOSE  --    < > 240* 315* 214* 167*  BUN  --    < > 29* 38* 65* 76*  CREATININE  --    < > 2.14* 2.65* 4.42* 4.58*  CALCIUM  --    < > 8.9 8.7* 8.3* 8.0*  PROT  --   --  6.6  --   --   --   ALBUMIN  --   --  3.0*  --   --   --  AST 28  --  27  --   --   --   ALT 24  --  28  --   --   --   ALKPHOS  --   --  104  --   --   --   BILITOT  --   --  0.7  --   --   --   GFRNONAA  --    < > 31* 24* 13* 12*  GFRAA  --    < > 36* 27* 15* 14*  ANIONGAP  --    < > 10 16* 12 10   < > = values in this interval not displayed.     Hematology Recent Labs  Lab 07/06/18 0705 07/07/18 1105 07/08/18 0614  WBC 14.9* 12.1* 10.7*  RBC 4.21* 3.65* 3.66*  HGB 12.5* 10.8* 10.6*  HCT 38.0* 32.6* 32.0*  MCV 90.3 89.3 87.4  MCH 29.7 29.6 29.0  MCHC 32.9 33.1 33.1  RDW 14.5 14.6 14.4  PLT 231 201 196    BNP Recent Labs  Lab 07/05/18 2006  BNP 546.7*     DDimer No results for input(s): DDIMER in the last 168 hours.   Radiology    Vas [place] Abi With/wo Tbi  Result Date: 07/07/2018 LOWER EXTREMITY DOPPLER STUDY Indications: Claudication.  Limitations: Patient body habitus Comparison Study: 12/26/16 - R 1.0 L 0.88 Performing Technologist: Carlos Levering Rvt  Examination Guidelines: A complete evaluation includes at minimum, Doppler waveform signals and systolic blood pressure reading at the level of bilateral brachial, anterior tibial, and posterior tibial arteries, when vessel segments are accessible. Bilateral  testing is considered an integral part of a complete examination. Photoelectric Plethysmograph (PPG) waveforms and toe systolic pressure readings are included as required and additional duplex testing as needed. Limited examinations for reoccurring indications may be performed as noted.  ABI Findings: +---------+------------------+-----+---------+--------+  Right     Rt Pressure (mmHg) Index Waveform  Comment   +---------+------------------+-----+---------+--------+  Brachial  129                      triphasic           +---------+------------------+-----+---------+--------+  PTA       113                0.88  triphasic           +---------+------------------+-----+---------+--------+  DP        84                 0.65  triphasic           +---------+------------------+-----+---------+--------+  Great Toe 79                 0.61                      +---------+------------------+-----+---------+--------+ +---------+------------------+-----+---------+-------+  Left      Lt Pressure (mmHg) Index Waveform  Comment  +---------+------------------+-----+---------+-------+  Brachial  121                      triphasic          +---------+------------------+-----+---------+-------+  PTA       254                1.97  triphasic          +---------+------------------+-----+---------+-------+  DP        103  0.80  triphasic          +---------+------------------+-----+---------+-------+  Great Toe 73                 0.57                     +---------+------------------+-----+---------+-------+ +-------+-----------+-----------+------------+------------+  ABI/TBI Today's ABI Today's TBI Previous ABI Previous TBI  +-------+-----------+-----------+------------+------------+  Right   0.88        0.61        1.0                        +-------+-----------+-----------+------------+------------+  Left    0.8         0.57        0.88                       +-------+-----------+-----------+------------+------------+   Summary: Right: Resting right ankle-brachial index indicates mild right lower extremity arterial disease. The right toe-brachial index is abnormal. Left: Resting left ankle-brachial index indicates mild left lower extremity arterial disease. The left toe-brachial index is abnormal.  *See table(s) above for measurements and observations.    Preliminary    Vas US Renal Artery Duplex  Result Date: 07/07/2018 ABDOMINAL VISCERAL Indications: Flash pulmonary edema High Risk Factors: Hypertension, Diabetes. Limitations: Obesity and air/bowel gas. Comparison Study: No prior studies. Performing Technologist: Oliver Hum RVT  Examination Guidelines: A complete evaluation includes B-mode imaging, spectral Doppler, color Doppler, and power Doppler as needed of all accessible portions of each vessel. Bilateral testing is considered an integral part of a complete examination. Limited examinations for reoccurring indications may be performed as noted.  Duplex Findings: +--------------------+--------+--------+------+-------------------+  Mesenteric           PSV cm/s EDV cm/s Plaque      Comments        +--------------------+--------+--------+------+-------------------+  Aorta Distal           136       22                                +--------------------+--------+--------+------+-------------------+  Celiac Artery Origin                          Unable to visualize  +--------------------+--------+--------+------+-------------------+  SMA Origin                                    Unable to visualize  +--------------------+--------+--------+------+-------------------+  +------------------+--------+--------+-------+  Right Renal Artery PSV cm/s EDV cm/s Comment  +------------------+--------+--------+-------+  Origin                90       12             +------------------+--------+--------+-------+  Proximal             114       14             +------------------+--------+--------+-------+  Mid                   79        10             +------------------+--------+--------+-------+  Distal  72       12             +------------------+--------+--------+-------+ +-----------------+--------+--------+-------+  Left Renal Artery PSV cm/s EDV cm/s Comment  +-----------------+--------+--------+-------+  Origin               36       7              +-----------------+--------+--------+-------+  Proximal             23       8              +-----------------+--------+--------+-------+  Mid                  26       10             +-----------------+--------+--------+-------+  Distal               25       5              +-----------------+--------+--------+-------+ Technologist observations Renal Artery(s):There is a 4cm length disparity between the right and left kidney. +------------+--------+--------+----+-----------+--------+--------+----+  Right Kidney PSV cm/s EDV cm/s RI   Left Kidney PSV cm/s EDV cm/s RI    +------------+--------+--------+----+-----------+--------+--------+----+  Upper Pole   31       6        0.79 Upper Pole  20       6        0.70  +------------+--------+--------+----+-----------+--------+--------+----+  Mid          29       7        0.74 Mid         19       7        0.63  +------------+--------+--------+----+-----------+--------+--------+----+  Lower Pole   24       3        0.86 Lower Pole  19       5        0.72  +------------+--------+--------+----+-----------+--------+--------+----+  Hilar        76       12       0.84 Hilar       25       7        0.72  +------------+--------+--------+----+-----------+--------+--------+----+ +------------------+-----+------------------+-----+  Right Kidney             Left Kidney               +------------------+-----+------------------+-----+  RAR                      RAR                       +------------------+-----+------------------+-----+  RAR (manual)       0.84  RAR (manual)       0.26   +------------------+-----+------------------+-----+  Cortex                    Cortex                    +------------------+-----+------------------+-----+  Cortex thickness         Corex thickness           +------------------+-----+------------------+-----+  Kidney length (cm) 15.00 Kidney length (cm) 11.00  +------------------+-----+------------------+-----+   Summary: Renal:  Right: Normal right Resisitive Index. 1-59%  stenosis of the right        renal artery. Left:  Normal left Resistive Index. 1-59% stenosis of the left renal        artery.  *See table(s) above for measurements and observations.     Preliminary     Cardiac Studies   TTE: 07/06/18  IMPRESSIONS    1. Stage 1: stage1: Multiple segmental abnormalities exist. See findings.  2. The left ventricle has a visually estimated ejection fraction of 45%. The cavity size was normal. There is moderate concentric left ventricular hypertrophy. Left ventricular diastolic Doppler parameters are consistent with pseudonormalization.  Elevated left ventricular end-diastolic pressure.  3. The right ventricle has normal systolic function. The cavity was normal. There is no increase in right ventricular wall thickness.  4. The mitral valve is degenerative. Mild thickening of the mitral valve leaflet. There is mild mitral annular calcification present.  5. The tricuspid valve is grossly normal.  6. The aortic valve was not well visualized. Mild thickening of the aortic valve.  7. Probably trileaflet.  8. The aortic root is normal in size and structure.  9. The interatrial septum appears to be lipomatous.  FINDINGS  Left Ventricle: The left ventricle has a visually estimated ejection fraction of 45. The cavity size was normal. There is moderate concentric left ventricular hypertrophy. Left ventricular diastolic Doppler parameters are consistent with  pseudonormalization. Elevated left ventricular end-diastolic pressure Definity contrast agent was given IV to delineate the left ventricular endocardial  borders.  LV Wall Scoring: The apical septal segment, apical lateral segment, and apex are akinetic. The apical anterior segment and apical inferior segment are hypokinetic.   Right Ventricle: The right ventricle has normal systolic function. The cavity was normal. There is no increase in right ventricular wall thickness.  Left Atrium: Left atrial size was normal in size.  Right Atrium: Right atrial size was normal in size. Right atrial pressure is estimated at 10 mmHg.  Interatrial Septum: No atrial level shunt detected by color flow Doppler. Increased thickness of the atrial septum sparing the fossa ovalis consistent with The interatrial septum appears to be lipomatous.  Pericardium: There is no evidence of pericardial effusion.  Mitral Valve: The mitral valve is degenerative in appearance. Mild thickening of the mitral valve leaflet. There is mild mitral annular calcification present. Mitral valve regurgitation is mild by color flow Doppler.  Tricuspid Valve: The tricuspid valve is grossly normal. Tricuspid valve regurgitation is trivial by color flow Doppler.  Aortic Valve: The aortic valve was not well visualized Mild thickening of the aortic valve. Aortic valve regurgitation was not visualized by color flow Doppler. There is no evidence of aortic valve stenosis. Probably trileaflet.  Pulmonic Valve: The pulmonic valve was not well visualized. Pulmonic valve regurgitation is not visualized by color flow Doppler.  Aorta: The aortic root is normal in size and structure.  Patient Profile     68 y.o. 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 Sherral Hammers.  Assessment & Plan    1. Acute on chronic systolic CHF: Presented with likely flash pulmonary edema. Has been  educated on diet, and fluid intake. Breathing is stable this morning.Echo in 2018 showed EF 45-50%. Ischemic cardiomyopathy.  Echo this admission showed moderate LVH with EF 45%,  similar to prior.  There is peri-apical akinesis consistent with known prior anterior MI. Renal artery dopplers showed only mild renal artery stenosis. Appears volume stable on exam. Cr up to 4.4>>4.5. Hopefully has peaked. - Continue Coreg 25 mg bid, holding losartan with AKI.  - Continue hydralazine 50 mg tid and increase Imdur to 60 mg daily.  - lasix remains held at this time  2. CAD: Patient had anterior MI in 2017 with LAD PCI. This was complicated by cardiac arrest and anoxic injury. No chest pain. His flash pulmonary edema event could have been ischemia-mediated, though he reports significant Na+ intake abd fluid intake prior to admission.The troponin is mildly elevated but flat trend. Suspect due to demand ischemia  - Continue ASA and atorvastatin.  - Considered coronary angiography to rule out new coronary disease, but given his rapid decline in renal function would hold off.   3. AKI on CKD stage 3: His wife states that he has had CKD stage 3 for 3-4 years. Baseline unknown. Creatinine up from 2.65>>4.42>>4.58. Suspect multifactorial with contrast for CTA PTA, states he was given dye twice as his IV infiltrated. Along with need for diuresis. Hopefully Cr has peaked. May need nephrology input if further decline. -- renal doppler with mild RAS  4. H/o CVA: At time of prior MI (complicated by cardiac arrest, CPR, prolonged hospitalization). He was anticoagulated due to low EF at the time and "substrate for LV thrombus" though does not appear this was confirmed.He has been on Eliquis at home.  - As EF is now up to 45%, as Dr. Aundra Dubin recommended to place on ASA + low dose Xarelto (COMPASS regimen) rather than apixaban alone. Atrial fibrillation has never been identified.   5. H/o anoxic encephalopathy: Has memory  trouble.   6. Leg pain: Dopplers with mild bilateral lower extremity arterial disease  7. IDDM: CBGs now controlled. May benefit from diabetes education  For questions or updates, please contact [facility] Please consult [email or web address] for contact info under   Signed, Reino Bellis, NP  07/08/2018, 8:17 AM    Patient seen, examined. Available data reviewed. Agree with findings, assessment, and plan as outlined by Reino Bellis, NP. On my exam today: Vitals:   07/08/18 0500 07/08/18 0753  BP: (!) 141/73 (!) 147/83  Pulse: 72 68  Resp: 18   Temp: 99.1 F (37.3 C)   SpO2: 95%    Pt is alert and oriented, pleasant obese male in NAD HEENT: normal Neck: JVP - moderately elevated Lungs: CTA bilaterally CV: RRR without murmur or gallop Abd: soft, NT, Positive BS, no hepatomegaly Ext: 1+ pretibial edema bilaterally, distal pulses intact and equal Skin: warm/dry no rash  All data is reviewed.  The patient is clinically improved, hopefully now at the plateau phase of his acute on chronic kidney injury likely multifactorial on background of congestive heart failure, diuresis, ARB therapy, and contrast administration for CT scans.  Agree with holding diuretic therapy today.  I discussed issues around anticoagulation with the patient.  He has been through all of the paperwork necessary in a patient assistance program for apixaban.  He has a good supply of this at home.  He does not like the idea of changing to rivaroxaban because of cost issues.  I am inclined to put him back on apixaban at the 5 mg twice daily dose when he is ready for discharge.  We will follow-up on his lab work tomorrow to see about his renal function, and likely  change him back to apixaban tomorrow.  The indication for anticoagulation is previous stroke with an akinetic LV apex and concern about cardioembolic events.  He has been on apixaban now for several years without any recurrent stroke or TIA  symptoms.    Sherren Mocha, M.D. 07/08/2018 12:49 PM

## 2018-07-09 ENCOUNTER — Other Ambulatory Visit: Payer: Self-pay

## 2018-07-09 ENCOUNTER — Encounter (HOSPITAL_COMMUNITY): Payer: Self-pay | Admitting: *Deleted

## 2018-07-09 LAB — CBC
HCT: 32.3 % — ABNORMAL LOW (ref 39.0–52.0)
Hemoglobin: 10.8 g/dL — ABNORMAL LOW (ref 13.0–17.0)
MCH: 29.7 pg (ref 26.0–34.0)
MCHC: 33.4 g/dL (ref 30.0–36.0)
MCV: 88.7 fL (ref 80.0–100.0)
Platelets: 201 10*3/uL (ref 150–400)
RBC: 3.64 MIL/uL — ABNORMAL LOW (ref 4.22–5.81)
RDW: 14.4 % (ref 11.5–15.5)
WBC: 10.8 10*3/uL — ABNORMAL HIGH (ref 4.0–10.5)
nRBC: 0 % (ref 0.0–0.2)

## 2018-07-09 LAB — BASIC METABOLIC PANEL
Anion gap: 9 (ref 5–15)
BUN: 76 mg/dL — ABNORMAL HIGH (ref 8–23)
CO2: 23 mmol/L (ref 22–32)
Calcium: 8.3 mg/dL — ABNORMAL LOW (ref 8.9–10.3)
Chloride: 103 mmol/L (ref 98–111)
Creatinine, Ser: 3.91 mg/dL — ABNORMAL HIGH (ref 0.61–1.24)
GFR calc Af Amer: 17 mL/min — ABNORMAL LOW (ref >60)
GFR calc non Af Amer: 15 mL/min — ABNORMAL LOW (ref >60)
Glucose, Bld: 159 mg/dL — ABNORMAL HIGH (ref 70–99)
Potassium: 4.6 mmol/L (ref 3.5–5.1)
Sodium: 135 mmol/L (ref 135–145)

## 2018-07-09 LAB — GLUCOSE, CAPILLARY
Glucose-Capillary: 179 mg/dL — ABNORMAL HIGH (ref 70–99)
Glucose-Capillary: 286 mg/dL — ABNORMAL HIGH (ref 70–99)
Glucose-Capillary: 175 mg/dL — ABNORMAL HIGH (ref 70–99)
Glucose-Capillary: 171 mg/dL — ABNORMAL HIGH (ref 70–99)

## 2018-07-09 LAB — MAGNESIUM: Magnesium: 1.9 mg/dL (ref 1.7–2.4)

## 2018-07-09 MED ORDER — APIXABAN 5 MG PO TABS
5.0000 mg | ORAL_TABLET | Freq: Two times a day (BID) | ORAL | Status: DC
  Administered 2018-07-09 – 2018-07-10 (×3): 5 mg via ORAL
  Filled 2018-07-09 (×3): qty 1

## 2018-07-09 NOTE — Evaluation (Addendum)
Physical Therapy Evaluation/ Discharge Patient Details Name: Gunnard Dorrance MRN: [id number] DOB: 1950/03/19 Today's Date: 07/09/2018   History of Present Illness  68 y.o. male with a PMH of CAD s/p STEMI, anoxic brain injury, chronic combined CHF, CVA, HTN, HLD, CKD 3 and DM. Admitted with acute dyspnea and diaphoresis and admitted with CHF exacerbation and demand ischemia  Clinical Impression  Pt very pleasant and reports living at home with wife who is currently being treated for CA. Pt attends to chickens and garden at home and states that he walks at least 10 min 2x/day to be able to perform chores. Pt reports no falls or difficulty with basic activities and educated for walking program and to increase walks to 3x/day and increase as able. Pt at baseline functional level with education provided for improved strength and cardiopulmonary function with pt able to verbalize understanding. No further acute therapy needs, will sign off with pt aware and agreeable.   HR 71-74 SpO2 96% on RA    Follow Up Recommendations No PT follow up    Equipment Recommendations  None recommended by PT    Recommendations for Other Services       Precautions / Restrictions Precautions Precautions: None      Mobility  Bed Mobility Overal bed mobility: Modified Independent                Transfers Overall transfer level: Modified independent                  Ambulation/Gait Ambulation/Gait assistance: Modified independent (Device/Increase time) Gait Distance (Feet): 400 Feet Assistive device: Rolling walker (2 wheeled) Gait Pattern/deviations: WFL(Within Functional Limits)   Gait velocity interpretation: >4.37 ft/sec, indicative of normal walking speed General Gait Details: pt utilized RW per his request to maximize distance and function with good posture and position throughout  Stairs Stairs: Yes Stairs assistance: Modified independent (Device/Increase time) Stair Management: One  rail Right;Alternating pattern;Forwards Number of Stairs: 11 General stair comments: pt able to ascend/descend with use of rail without LOB  Wheelchair Mobility    Modified Rankin (Stroke Patients Only)       Balance Overall balance assessment: No apparent balance deficits (not formally assessed)                                           Pertinent Vitals/Pain Pain Assessment: No/denies pain    Home Living Family/patient expects to be discharged to:: Private residence Living Arrangements: Spouse/significant other Available Help at Discharge: Family;Available 24 hours/day Type of Home: House Home Access: Stairs to enter   [employer] of Steps: 1 Home Layout: Two level;Bed/bath upstairs Home Equipment: Walker - 4 wheels;Cane - single point;Shower seat - built in      Prior Function Level of Independence: Independent         Comments: pt does the grocery shopping and more housework of late as wife has CA and is undergoing radiation     Hand Dominance        Extremity/Trunk Assessment   Upper Extremity Assessment Upper Extremity Assessment: Generalized weakness    Lower Extremity Assessment Lower Extremity Assessment: Generalized weakness    Cervical / Trunk Assessment Cervical / Trunk Assessment: Normal  Communication   Communication: No difficulties  Cognition Arousal/Alertness: Awake/alert Behavior During Therapy: WFL for tasks assessed/performed Overall Cognitive Status: Within Functional Limits for tasks assessed  General Comments      Exercises     Assessment/Plan    PT Assessment Patent does not need any further PT services  PT Problem List         PT Treatment Interventions      PT Goals (Current goals can be found in the Care Plan section)  Acute Rehab PT Goals Patient Stated Goal: return home to the chickens and garden PT Goal Formulation: All  assessment and education complete, DC therapy    Frequency     Barriers to discharge        Co-evaluation               AM-PAC PT "6 Clicks" Mobility  Outcome Measure Help needed turning from your back to your side while in a flat bed without using bedrails?: None Help needed moving from lying on your back to sitting on the side of a flat bed without using bedrails?: None Help needed moving to and from a bed to a chair (including a wheelchair)?: None Help needed standing up from a chair using your arms (e.g., wheelchair or bedside chair)?: None Help needed to walk in hospital room?: None Help needed climbing 3-5 steps with a railing? : None 6 Click Score: 24    End of Session Equipment Utilized During Treatment: Gait belt Activity Tolerance: Patient tolerated treatment well Patient left: in chair;with chair alarm set;with nursing/sitter in room Nurse Communication: Mobility status PT Visit Diagnosis: Other abnormalities of gait and mobility (R26.89)    Time: 2878-6767 PT Time Calculation (min) (ACUTE ONLY): 20 min   Charges:   PT Evaluation $PT Eval Moderate Complexity: [place] Pager: [phone] Office: [place] 07/09/2018, 12:08 PM

## 2018-07-09 NOTE — Discharge Instructions (Addendum)
Heart Failure, Self Care Heart failure is a serious condition. This sheet explains things you need to do to take care of yourself at home. To help you stay as healthy as possible, you may be asked to change your diet, take certain medicines, and make other changes in your life. Your doctor may also give you more specific instructions. If you have problems or questions, call your doctor. What are the risks? Having heart failure makes it more likely for you to have some problems. These problems can get worse if you do not take good care of yourself. Problems may include:  Blood clotting problems. This may cause a stroke.  Damage to the kidneys, liver, or lungs.  Abnormal heart rhythms. Supplies needed:  Scale for weighing yourself.  Blood pressure monitor.  Notebook.  Medicines. How to care for yourself when you have heart failure Medicines Take over-the-counter and prescription medicines only as told by your doctor. Take your medicines every day.  Do not stop taking your medicine unless your doctor tells you to do so.  Do not skip any medicines.  Get your prescriptions refilled before you run out of medicine. This is important. Eating and drinking   Eat heart-healthy foods. Talk with a diet specialist (dietitian) to create an eating plan.  Choose foods that: ? Have no trans fat. ? Are low in saturated fat and cholesterol.  Choose healthy foods, such as: ? Fresh or frozen fruits and vegetables. ? Fish. ? Low-fat (lean) meats. ? Legumes, such as beans, peas, and lentils. ? Fat-free or low-fat dairy products. ? Whole-grain foods. ? High-fiber foods.  Limit salt (sodium) if told by your doctor. Ask your diet specialist to tell you which seasonings are healthy for your heart.  Cook in healthy ways instead of frying. Healthy ways of cooking include roasting, grilling, broiling, baking, poaching, steaming, and stir-frying.  Limit how much fluid you drink, if told by your  doctor. Alcohol use  Do not drink alcohol if: ? Your doctor tells you not to drink. ? Your heart was damaged by alcohol, or you have very bad heart failure. ? You are pregnant, may be pregnant, or are planning to become pregnant.  If you drink alcohol: ? Limit how much you use to:  0-1 drink a day for women.  0-2 drinks a day for men. ? Be aware of how much alcohol is in your drink. In the [place] one drink equals one 12 oz bottle of beer (355 mL), one 5 oz glass of wine (148 mL), or one 1 oz glass of hard liquor (44 mL). Lifestyle   Do not use any products that contain nicotine or tobacco, such as cigarettes, e-cigarettes, and chewing tobacco. If you need help quitting, ask your doctor. ? Do not use nicotine gum or patches before talking to your doctor.  Do not use illegal drugs.  Lose weight if told by your doctor.  Do physical activity if told by your doctor. Talk to your doctor before you begin an exercise if: ? You are an older adult. ? You have very bad heart failure.  Learn to manage stress. If you need help, ask your doctor.  Get rehab (rehabilitation) to help you stay independent and to help with your quality of life.  Plan time to rest when you get tired. Check weight and blood pressure   Weigh yourself every day. This will help you to know if fluid is building up in your body. ? Weigh yourself every  morning after you pee (urinate) and before you eat breakfast. ? Wear the same amount of clothing each time. ? Write down your daily weight. Give your record to your doctor.  Check and write down your blood pressure as told by your doctor.  Check your pulse as told by your doctor. Dealing with very hot and very cold weather  If it is very hot: ? Avoid activities that take a lot of energy. ? Use air conditioning or fans, or find a cooler place. ? Avoid caffeine and alcohol. ? Wear clothing that is loose-fitting, lightweight, and light-colored.  If it is very  cold: ? Avoid activities that take a lot of energy. ? Layer your clothes. ? Wear mittens or gloves, a hat, and a scarf when you go outside. ? Avoid alcohol. Follow these instructions at home:  Stay up to date with shots (vaccines). Get pneumococcal and flu (influenza) shots.  Keep all follow-up visits as told by your doctor. This is important. Contact a doctor if:  You gain weight quickly.  You have increasing shortness of breath.  You cannot do your normal activities.  You get tired easily.  You cough a lot.  You don't feel like eating or feel like you may vomit (nauseous).  You become puffy (swell) in your hands, feet, ankles, or belly (abdomen).  You cannot sleep well because it is hard to breathe.  You feel like your heart is beating fast (palpitations).  You get dizzy when you stand up. Get help right away if:  You have trouble breathing.  You or someone else notices a change in your behavior, such as having trouble staying awake.  You have chest pain or discomfort.  You pass out (faint). These symptoms may be an emergency. Do not wait to see if the symptoms will go away. Get medical help right away. Call your local emergency services (911 in the [place] Do not drive yourself to the hospital. Summary  Heart failure is a serious condition. To care for yourself, you may have to change your diet, take medicines, and make other lifestyle changes.  Take your medicines every day. Do not stop taking them unless your doctor tells you to do so.  Eat heart-healthy foods, such as fresh or frozen fruits and vegetables, fish, lean meats, legumes, fat-free or low-fat dairy products, and whole-grain or high-fiber foods.  Ask your doctor if you can drink alcohol. You may have to stop alcohol use if you have very bad heart failure.  Contact your doctor if you gain weight quickly or feel that your heart is beating too fast. Get help right away if you pass out, or have chest pain  or trouble breathing. This information is not intended to replace advice given to you by your health care provider. Make sure you discuss any questions you have with your health care provider. Document Released: 04/10/2018 Document Revised: 04/09/2018 Document Reviewed: 04/10/2018 Elsevier Patient Education  2020 [place].   Heart Failure Eating Plan Heart failure, also called congestive heart failure, occurs when your heart does not pump blood well enough to meet your body's needs for oxygen-rich blood. Heart failure is a long-term (chronic) condition. Living with heart failure can be challenging. However, following your health care provider's instructions about a healthy lifestyle and working with a diet and nutrition specialist (dietitian) to choose the right foods may help to improve your symptoms. What are tips for following this plan? Reading food labels  Check food labels for the  amount of sodium per serving. Choose foods that have less than 140 mg (milligrams) of sodium in each serving.  Check food labels for the number of calories per serving. This is important if you need to limit your daily calorie intake to lose weight.  Check food labels for the serving size. If you eat more than one serving, you will be eating more sodium and calories than what is listed on the label.  Look for foods that are labeled as "sodium-free," "very low sodium," or "low sodium." ? Foods labeled as "reduced sodium" or "lightly salted" may still have more sodium than what is recommended for you. Cooking  Avoid adding salt when cooking. Ask your health care provider or dietitian before using salt substitutes.  Season food with salt-free seasonings, spices, or herbs. Check the label of seasoning mixes to make sure they do not contain salt.  Cook with heart-healthy oils, such as olive, canola, soybean, or sunflower oil.  Do not fry foods. Cook foods using low-fat methods, such as baking, boiling,  grilling, and broiling.  Limit unhealthy fats when cooking by: ? Removing the skin from poultry, such as chicken. ? Removing all visible fats from meats. ? Skimming the fat off from stews, soups, and gravies before serving them. Meal planning   Limit your intake of: ? Processed, canned, or pre-packaged foods. ? Foods that are high in trans fat, such as fried foods. ? Sweets, desserts, sugary drinks, and other foods with added sugar. ? Full-fat dairy products, such as whole milk.  Eat a balanced diet that includes: ? 4-5 servings of fruit each day and 4-5 servings of vegetables each day. At each meal, try to fill half of your plate with fruits and vegetables. ? Up to 6-8 servings of whole grains each day. ? Up to 2 servings of lean meat, poultry, or fish each day. One serving of meat is equal to 3 oz. This is about the same size as a deck of cards. ? 2 servings of low-fat dairy each day. ? Heart-healthy fats. Healthy fats called omega-3 fatty acids are found in foods such as flaxseed and cold-water fish like sardines, salmon, and mackerel.  Aim to eat 25-35 g (grams) of fiber a day. Foods that are high in fiber include apples, broccoli, carrots, beans, peas, and whole grains.  Do not add salt or condiments that contain salt (such as soy sauce) to foods before eating.  When eating at a restaurant, ask that your food be prepared with less salt or no salt, if possible.  Try to eat 2 or more vegetarian meals each week.  Eat more home-cooked food and eat less restaurant, buffet, and fast food. General information  Do not eat more than 2,300 mg of salt (sodium) a day. The amount of sodium that is recommended for you may be lower, depending on your condition.  Maintain a healthy body weight as directed. Ask your health care provider what a healthy weight is for you. ? Check your weight every day. ? Work with your health care provider and dietitian to make a plan that is right for you to  lose weight or maintain your current weight.  Limit how much fluid you drink. Ask your health care provider or dietitian how much fluid you can have each day.  Limit or avoid alcohol as told by your health care provider or dietitian. Recommended foods The items listed may not be a complete list. Talk with your dietitian about what dietary choices  are best for you. Fruits All fresh, frozen, and canned fruits. Dried fruits, such as raisins, prunes, and cranberries. Vegetables All fresh vegetables. Vegetables that are frozen without sauce or added salt. Low-sodium or sodium-free canned vegetables. Grains Bread with less than 80 mg of sodium per slice. Whole-wheat pasta, quinoa, and brown rice. Oats and oatmeal. Barley. [place]. Grits and cream of wheat. Whole-grain and whole-wheat cold cereal. Meats and other protein foods Lean cuts of meat. Skinless chicken and [place]. Fish with high omega-3 fatty acids, such as salmon, sardines, and other cold-water fishes. Eggs. Dried beans, peas, and edamame. Unsalted nuts and nut butters. Dairy Low-fat or nonfat (skim) milk and dried milk. Rice milk, soy milk, and almond milk. Low-fat or nonfat yogurt. Small amounts of reduced-sodium block cheese. Low-sodium cottage cheese. Fats and oils Olive, canola, soybean, flaxseed, or sunflower oil. Avocado. Sweets and desserts Apple sauce. Granola bars. Sugar-free pudding and gelatin. Frozen fruit bars. Seasoning and other foods Fresh and dried herbs. Lemon or lime juice. Vinegar. Low-sodium ketchup. Salt-free marinades, salad dressings, sauces, and seasonings. The items listed above may not be a complete list of foods and beverages you can eat. Contact a dietitian for more information. Foods to avoid The items listed may not be a complete list. Talk with your dietitian about what dietary choices are best for you. Fruits Fruits that are dried with sodium-containing preservatives. Vegetables Canned vegetables.  Frozen vegetables with sauce or seasonings. Creamed vegetables. [place] fries. Onion rings. Pickled vegetables and sauerkraut. Grains Bread with more than 80 mg of sodium per slice. Hot or cold cereal with more than 140 mg sodium per serving. Salted pretzels and crackers. Pre-packaged breadcrumbs. Bagels, croissants, and biscuits. Meats and other protein foods Ribs and chicken wings. Bacon, ham, pepperoni, bologna, salami, and packaged luncheon meats. Hot dogs, bratwurst, and sausage. Canned meat. Smoked meat and fish. Salted nuts and seeds. Dairy Whole milk, half-and-half, and cream. Buttermilk. Processed cheese, cheese spreads, and cheese curds. Regular cottage cheese. Feta cheese. Shredded cheese. String cheese. Fats and oils Butter, lard, shortening, ghee, and bacon fat. Canned and packaged gravies. Seasoning and other foods Onion salt, garlic salt, table salt, and sea salt. Marinades. Regular salad dressings. Relishes, pickles, and olives. Meat flavorings and tenderizers, and bouillon cubes. Horseradish, ketchup, and mustard. Worcestershire sauce. Teriyaki sauce, soy sauce (including reduced sodium). Hot sauce and Tabasco sauce. Steak sauce, fish sauce, oyster sauce, and cocktail sauce. Taco seasonings. Barbecue sauce. Tartar sauce. The items listed above may not be a complete list of foods and beverages you should avoid. Contact a dietitian for more information. Summary  A heart failure eating plan includes changes that limit your intake of sodium and unhealthy fat, and it may help you lose weight or maintain a healthy weight. Your health care provider may also recommend limiting how much fluid you drink.  Most people with heart failure should eat no more than 2,300 mg of salt (sodium) a day. The amount of sodium that is recommended for you may be lower, depending on your condition.  Contact your health care provider or dietitian before making any major changes to your diet. This information  is not intended to replace advice given to you by your health care provider. Make sure you discuss any questions you have with your health care provider. Document Released: 05/12/2016 Document Revised: 02/21/2018 Document Reviewed: 05/12/2016 Elsevier Patient Education  [place].   Heart Failure and Exercise Heart failure is a condition in which the heart does  not fill or pump enough blood and oxygen to support your body and its functions. Heart failure is a long-term (chronic) condition. Living with heart failure can be challenging. However, following your health care provider's instructions about a healthy lifestyle may help improve your symptoms. This includes choosing the right exercise plan. Doing daily physical activity is important after a diagnosis of heart failure. You may have some activity restrictions, so talk to your health care provider before doing any exercises. What are the benefits of exercise? Exercise may:  Make your heart muscles stronger.  Lower your blood pressure.  Lower your cholesterol.  Help you lose weight.  Help your bones stay strong.  Improve your blood circulation.  Help your body use oxygen better. This relieves symptoms such as fatigue and shortness of breath.  Help your mental health by lowering the risk of depression and other problems.  Improve your quality of life.  Decrease your chance of hospital admission for heart failure. What is an exercise plan? An exercise plan is a set of specific exercises and training activities. You will work with your health care provider to create the exercise plan that works for you. The plan may include:  Different types of exercises and how to do them.  Cardiac rehabilitation exercises. These are supervised programs that are designed to strengthen your heart. What are strengthening exercises? Strengthening exercises are a type of physical activity that involves using resistance to improve your  muscle strength. Strengthening exercises usually have repetitive motions. These types of exercises can include:  Lifting weights.  Using weight machines.  Using resistance tubes and bands.  Using kettlebells.  Using your body weight, such as doing push-ups or squats. What are balance exercises? Balance exercises are another type of physical activity. They strengthen the muscles of the back, stomach, and pelvis (core muscles) and improve your balance. They can also lower your risk of falling. These types of exercises can include:  Standing on one leg.  Walking backward, sideways, and in a straight line.  Standing up after sitting, without using your hands.  Shifting your weight from one leg to the other.  Lifting one leg in front of you.  Doing tai chi. This is a type of exercise that uses slow movements and deep breathing. How can I increase my flexibility? Having better flexibility can keep you from falling. It can also lengthen your muscles, improve your range of motion, and help your joints. You can increase your flexibility by:  Doing tai chi.  Doing yoga.  Stretching. How much aerobic exercise should I get?  Aerobic exercises strengthen your breathing and circulation system and increase your body's use of oxygen. Examples of aerobic exercise include biking, walking, running, and swimming. Talk to your health care provider to find out how much aerobic exercise is safe for you.  To do these exercises:  Start exercising slowly, limiting the amount of time at first. You may need to start with 5 minutes of aerobic exercise every day.  Slowly add more minutes until you can safely do at least 30 minutes of exercise at least 4 days a week. Summary  Daily physical activity is important after a diagnosis of heart failure.  Exercise can make your heart muscles stronger. It also offers other benefits that will improve your health.  Talk to your health care provider before  doing any exercises. This information is not intended to replace advice given to you by your health care provider. Make sure you discuss any  questions you have with your health care provider. Document Released: 05/09/2016 Document Revised: 05/12/2016 Document Reviewed: 05/09/2016 Elsevier Patient Education  2020 [place] on my medicine - ELIQUIS (apixaban)  Why was Eliquis prescribed for you? Eliquis was prescribed for you to reduce the risk of a blood clot.  What do You need to know about Eliquis ? Take your Eliquis TWICE DAILY - one tablet in the morning and one tablet in the evening with or without food. If you have difficulty swallowing the tablet whole please discuss with your pharmacist how to take the medication safely.  Take Eliquis exactly as prescribed by your doctor and DO NOT stop taking Eliquis without talking to the doctor who prescribed the medication.  Stopping may increase your risk of developing a stroke.  Refill your prescription before you run out.  After discharge, you should have regular check-up appointments with your healthcare provider that is prescribing your Eliquis.  In the future your dose may need to be changed if your kidney function or weight changes by a significant amount or as you get older.  What do you do if you miss a dose? If you miss a dose, take it as soon as you remember on the same day and resume taking twice daily.  Do not take more than one dose of ELIQUIS at the same time to make up a missed dose.  Important Safety Information A possible side effect of Eliquis is bleeding. You should call your healthcare provider right away if you experience any of the following: ? Bleeding from an injury or your nose that does not stop. ? Unusual colored urine (red or dark brown) or unusual colored stools (red or black). ? Unusual bruising for unknown reasons. ? A serious fall or if you hit your head (even if there is no  bleeding).  Some medicines may interact with Eliquis and might increase your risk of bleeding or clotting while on Eliquis. To help avoid this, consult your healthcare provider or pharmacist prior to using any new prescription or non-prescription medications, including herbals, vitamins, non-steroidal anti-inflammatory drugs (NSAIDs) and supplements.  This website has more information on Eliquis (apixaban): [email or web address]

## 2018-07-09 NOTE — Progress Notes (Addendum)
Progress Note  Patient Name: Adam Carney Date of Encounter: 07/09/2018  Primary Cardiologist: Jenne Campus, MD   Subjective   Feeling well this morning. Needs to ambulate in the hallway  Inpatient Medications    Scheduled Meds:  aspirin EC  81 mg Oral Daily   atorvastatin  80 mg Oral q1800   carvedilol  25 mg Oral BID WC   hydrALAZINE  50 mg Oral Q8H   insulin aspart  0-15 Units Subcutaneous TID WC   insulin aspart  8 Units Subcutaneous TID WC   insulin glargine  20 Units Subcutaneous BID   isosorbide mononitrate  30 mg Oral Daily   levothyroxine  25 mcg Oral Q0600   pantoprazole  40 mg Oral Daily   rivaroxaban  2.5 mg Oral BID   sodium chloride flush  3 mL Intravenous Q12H   Continuous Infusions:  sodium chloride     PRN Meds: sodium chloride, acetaminophen, nitroGLYCERIN, ondansetron (ZOFRAN) IV, sodium chloride flush   Vital Signs    Vitals:   07/08/18 2027 07/08/18 2052 07/09/18 0500 07/09/18 0507  BP: 136/66   (!) 141/62  Pulse:  62    Resp:  18    Temp:  98.4 F (36.9 C) 98.6 F (37 C)   TempSrc:   Oral   SpO2:  97%  96%  Weight:   (!) 138.3 kg   Height:        Intake/Output Summary (Last 24 hours) at 07/09/2018 0820 Last data filed at 07/08/2018 1836 Gross per 24 hour  Intake --  Output 1100 ml  Net -1100 ml   Last 3 Weights 07/09/2018 07/08/2018 07/07/2018  Weight (lbs) 304 lb 14.3 oz 307 lb 9.6 oz 305 lb 12.8 oz  Weight (kg) 138.3 kg 139.526 kg 138.71 kg      Telemetry    SR - Personally Reviewed  ECG    No new tracing this morning - Personally Reviewed  Physical Exam  Older WM, pleasant sitting up in chair. GEN: No acute distress.   Neck: No JVD Cardiac: RRR, no murmurs, rubs, or gallops.  Respiratory: Clear to auscultation bilaterally. GI: Soft, nontender, non-distended  MS: 1+ pretibial edema, No deformity. Neuro:  Nonfocal  Psych: Normal affect   Labs    High Sensitivity Troponin:   Recent Labs  Lab  07/05/18 2006 07/06/18 0705 07/06/18 1256  TROPONINIHS 531*   454* 316* 427*      Cardiac EnzymesNo results for input(s): TROPONINI in the last 168 hours. No results for input(s): TROPIPOC in the last 168 hours.   Chemistry Recent Labs  Lab 07/03/18 0813 07/05/18 2006  07/07/18 1219 07/08/18 0614 07/09/18 0425  NA  --  137   < > 133* 134* 135  K  --  4.5   < > 4.4 4.4 4.6  CL  --  105   < > 100 103 103  CO2  --  22   < > 21* 21* 23  GLUCOSE  --  240*   < > 214* 167* 159*  BUN  --  29*   < > 65* 76* 76*  CREATININE  --  2.14*   < > 4.42* 4.58* 3.91*  CALCIUM  --  8.9   < > 8.3* 8.0* 8.3*  PROT  --  6.6  --   --   --   --   ALBUMIN  --  3.0*  --   --   --   --  AST 28 27  --   --   --   --   ALT 24 28  --   --   --   --   ALKPHOS  --  104  --   --   --   --   BILITOT  --  0.7  --   --   --   --   GFRNONAA  --  31*   < > 13* 12* 15*  GFRAA  --  36*   < > 15* 14* 17*  ANIONGAP  --  10   < > 12 10 9    < > = values in this interval not displayed.     Hematology Recent Labs  Lab 07/07/18 1105 07/08/18 0614 07/09/18 0425  WBC 12.1* 10.7* 10.8*  RBC 3.65* 3.66* 3.64*  HGB 10.8* 10.6* 10.8*  HCT 32.6* 32.0* 32.3*  MCV 89.3 87.4 88.7  MCH 29.6 29.0 29.7  MCHC 33.1 33.1 33.4  RDW 14.6 14.4 14.4  PLT 201 196 201    BNP Recent Labs  Lab 07/05/18 2006  BNP 546.7*     DDimer No results for input(s): DDIMER in the last 168 hours.   Radiology    Vas [place] Abi With/wo Tbi  Result Date: 07/08/2018 LOWER EXTREMITY DOPPLER STUDY Indications: Claudication.  Limitations: Patient body habitus Comparison Study: 12/26/16 - R 1.0 L 0.88 Performing Technologist: Carlos Levering Rvt  Examination Guidelines: A complete evaluation includes at minimum, Doppler waveform signals and systolic blood pressure reading at the level of bilateral brachial, anterior tibial, and posterior tibial arteries, when vessel segments are accessible. Bilateral testing is considered an integral part of a  complete examination. Photoelectric Plethysmograph (PPG) waveforms and toe systolic pressure readings are included as required and additional duplex testing as needed. Limited examinations for reoccurring indications may be performed as noted.  ABI Findings: +---------+------------------+-----+---------+--------+  Right     Rt Pressure (mmHg) Index Waveform  Comment   +---------+------------------+-----+---------+--------+  Brachial  129                      triphasic           +---------+------------------+-----+---------+--------+  PTA       113                0.88  triphasic           +---------+------------------+-----+---------+--------+  DP        84                 0.65  triphasic           +---------+------------------+-----+---------+--------+  Great Toe 79                 0.61                      +---------+------------------+-----+---------+--------+ +---------+------------------+-----+---------+-------+  Left      Lt Pressure (mmHg) Index Waveform  Comment  +---------+------------------+-----+---------+-------+  Brachial  121                      triphasic          +---------+------------------+-----+---------+-------+  PTA       254                1.97  triphasic          +---------+------------------+-----+---------+-------+  DP        103  0.80  triphasic          +---------+------------------+-----+---------+-------+  Great Toe 73                 0.57                     +---------+------------------+-----+---------+-------+ +-------+-----------+-----------+------------+------------+  ABI/TBI Today's ABI Today's TBI Previous ABI Previous TBI  +-------+-----------+-----------+------------+------------+  Right   0.88        0.61        1.0                        +-------+-----------+-----------+------------+------------+  Left    0.8         0.57        0.88                       +-------+-----------+-----------+------------+------------+  Summary: Right: Resting right ankle-brachial  index indicates mild right lower extremity arterial disease. The right toe-brachial index is abnormal. Left: Resting left ankle-brachial index indicates mild left lower extremity arterial disease. The left toe-brachial index is abnormal.  *See table(s) above for measurements and observations.  Electronically signed by Monica Martinez MD on 07/08/2018 at 3:20:36 PM.   Final    Vas US Renal Artery Duplex  Result Date: 07/08/2018 ABDOMINAL VISCERAL Indications: Flash pulmonary edema High Risk Factors: Hypertension, Diabetes. Limitations: Obesity and air/bowel gas. Comparison Study: No prior studies. Performing Technologist: Oliver Hum RVT  Examination Guidelines: A complete evaluation includes B-mode imaging, spectral Doppler, color Doppler, and power Doppler as needed of all accessible portions of each vessel. Bilateral testing is considered an integral part of a complete examination. Limited examinations for reoccurring indications may be performed as noted.  Duplex Findings: +--------------------+--------+--------+------+-------------------+  Mesenteric           PSV cm/s EDV cm/s Plaque      Comments        +--------------------+--------+--------+------+-------------------+  Aorta Distal           136       22                                +--------------------+--------+--------+------+-------------------+  Celiac Artery Origin                          Unable to visualize  +--------------------+--------+--------+------+-------------------+  SMA Origin                                    Unable to visualize  +--------------------+--------+--------+------+-------------------+  +------------------+--------+--------+-------+  Right Renal Artery PSV cm/s EDV cm/s Comment  +------------------+--------+--------+-------+  Origin                90       12             +------------------+--------+--------+-------+  Proximal             114       14             +------------------+--------+--------+-------+  Mid                    79       10             +------------------+--------+--------+-------+  Distal  72       12             +------------------+--------+--------+-------+ +-----------------+--------+--------+-------+  Left Renal Artery PSV cm/s EDV cm/s Comment  +-----------------+--------+--------+-------+  Origin               36       7              +-----------------+--------+--------+-------+  Proximal             23       8              +-----------------+--------+--------+-------+  Mid                  26       10             +-----------------+--------+--------+-------+  Distal               25       5              +-----------------+--------+--------+-------+ Technologist observations Renal Artery(s):There is a 4cm length disparity between the right and left kidney. +------------+--------+--------+----+-----------+--------+--------+----+  Right Kidney PSV cm/s EDV cm/s RI   Left Kidney PSV cm/s EDV cm/s RI    +------------+--------+--------+----+-----------+--------+--------+----+  Upper Pole   31       6        0.79 Upper Pole  20       6        0.70  +------------+--------+--------+----+-----------+--------+--------+----+  Mid          29       7        0.74 Mid         19       7        0.63  +------------+--------+--------+----+-----------+--------+--------+----+  Lower Pole   24       3        0.86 Lower Pole  19       5        0.72  +------------+--------+--------+----+-----------+--------+--------+----+  Hilar        76       12       0.84 Hilar       25       7        0.72  +------------+--------+--------+----+-----------+--------+--------+----+ +------------------+-----+------------------+-----+  Right Kidney             Left Kidney               +------------------+-----+------------------+-----+  RAR                      RAR                       +------------------+-----+------------------+-----+  RAR (manual)       0.84  RAR (manual)       0.26    +------------------+-----+------------------+-----+  Cortex                   Cortex                    +------------------+-----+------------------+-----+  Cortex thickness         Corex thickness           +------------------+-----+------------------+-----+  Kidney length (cm) 15.00 Kidney length (cm) 11.00  +------------------+-----+------------------+-----+  Summary: Renal:  Right: Normal right Resisitive Index. 1-59% stenosis  of the right        renal artery. Left:  Normal left Resistive Index. 1-59% stenosis of the left renal        artery.  *See table(s) above for measurements and observations.  Diagnosing physician: Monica Martinez MD  Electronically signed by Monica Martinez MD on 07/08/2018 at 3:18:50 PM.    Final     Cardiac Studies   TTE: 07/06/18  IMPRESSIONS   1. Stage 1: stage1: Multiple segmental abnormalities exist. See findings. 2. The left ventricle has a visually estimated ejection fraction of 45%. The cavity size was normal. There is moderate concentric left ventricular hypertrophy. Left ventricular diastolic Doppler parameters are consistent with pseudonormalization.  Elevated left ventricular end-diastolic pressure. 3. The right ventricle has normal systolic function. The cavity was normal. There is no increase in right ventricular wall thickness. 4. The mitral valve is degenerative. Mild thickening of the mitral valve leaflet. There is mild mitral annular calcification present. 5. The tricuspid valve is grossly normal. 6. The aortic valve was not well visualized. Mild thickening of the aortic valve. 7. Probably trileaflet. 8. The aortic root is normal in size and structure. 9. The interatrial septum appears to be lipomatous.  FINDINGS Left Ventricle: The left ventricle has a visually estimated ejection fraction of 45. The cavity size was normal. There is moderate concentric left ventricular hypertrophy. Left ventricular diastolic Doppler parameters are  consistent with  pseudonormalization. Elevated left ventricular end-diastolic pressure Definity contrast agent was given IV to delineate the left ventricular endocardial borders.  LV Wall Scoring: The apical septal segment, apical lateral segment, and apex are akinetic. The apical anterior segment and apical inferior segment are hypokinetic.   Right Ventricle: The right ventricle has normal systolic function. The cavity was normal. There is no increase in right ventricular wall thickness.  Left Atrium: Left atrial size was normal in size.  Right Atrium: Right atrial size was normal in size. Right atrial pressure is estimated at 10 mmHg.  Interatrial Septum: No atrial level shunt detected by color flow Doppler. Increased thickness of the atrial septum sparing the fossa ovalis consistent with The interatrial septum appears to be lipomatous.  Pericardium: There is no evidence of pericardial effusion.  Mitral Valve: The mitral valve is degenerative in appearance. Mild thickening of the mitral valve leaflet. There is mild mitral annular calcification present. Mitral valve regurgitation is mild by color flow Doppler.  Tricuspid Valve: The tricuspid valve is grossly normal. Tricuspid valve regurgitation is trivial by color flow Doppler.  Aortic Valve: The aortic valve was not well visualized Mild thickening of the aortic valve. Aortic valve regurgitation was not visualized by color flow Doppler. There is no evidence of aortic valve stenosis. Probably trileaflet.  Pulmonic Valve: The pulmonic valve was not well visualized. Pulmonic valve regurgitation is not visualized by color flow Doppler.  Aorta: The aortic root is normal in size and structure.  Patient Profile     68 y.o. 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 Sherral Hammers.  Assessment & Plan    1. Acute on chronic systolic CHF: Presented with likely flash pulmonary edema. Has been educated on diet, and fluid intake.Echo this admission showed moderate LVH with EF 45%, similar to prior. There is peri-apical akinesis consistent  with known prior anterior MI.Renal artery dopplers showed only mild renal artery stenosis. Remains volume stable on exam. Auto-diuresed 1.1L yesterday. Cr 4.4>>4.5>>3.9.  - Continue Coreg 25 mg bid,hydralazine 50 mg tid and Imdur to 60mg  daily. Holdinglosartan with AKI.   - lasix remains held at this time, though he will need daily dosing at home.   2. CAD: Patient had anterior MI in 2017 with LAD PCI. This was complicated by cardiac arrest and anoxic injury. No chest pain. His flash pulmonary edema event could have been ischemia-mediated, though he reports significant Na+ intake abd fluid intake prior to admission.The troponin is mildly elevated but flat trend.Suspectdue to demand ischemia. No chest pain. - Continue ASA and atorvastatin.   3. AKI on CKD stage 3: His wife states that he has had CKD stage 3 for 3-4 years. Baseline unknown. Creatinine up from 2.65>>4.42>>4.58>>3.91. Suspect multifactorial with contrast for CTA PTA, states he was given dye twice as his IV infiltrated. Along with need for diuresis. Follow BMET -- renal doppler with mild RAS  4. H/o CVA: At time of prior MI (complicated by cardiac arrest, CPR, prolonged hospitalization). He was anticoagulated due to low EF at the time and "substrate for LV thrombus" though does not appear this was confirmed.He has been on Eliquis at home.  - Dr. Aundra Dubin recommended to place on ASA + low dose Xarelto (COMPASS regimen) rather than apixaban alone. Cost will be an issue with his out of pocket being >$100. Therefore would recommend placing back on Eliquis as he has had no bleeding issues.   5. H/o anoxic  encephalopathy: Has memory trouble, but alert and oriented.   6. Leg pain: Dopplers with mild bilateral lower extremity arterial disease  7. IDDM: CBGs now controlled. May benefit from diabetes education  For questions or updates, please contact [facility] Please consult [email or web address] for contact info under    Signed, Reino Bellis, NP  07/09/2018, 8:20 AM    I have personally seen and examined this patient with Reino Bellis, NP. I agree with the assessment and plan as outlined above. I have extensively reviewed his records. He seem to be doing well this am. Mild LE edema on my exam but his lungs are clear. He is anxious to go home. Creatinine trending down to 3.9 today. Baseline creatinine is around 2.65. His acute renal failure is felt to be a combination of congestive heart failure, diuretics, ARB therapy, and contrast administration for CT scans. -I would continue Coreg, hydralazine and Imdur. I would not restart Losartan yet. -Keep hospitalized for one more day. Repeat BMET in am. If renal function improved, can consider restarting Lasix at home dose of 60 mg in am. May need to involve the dietician for counseling. Fluid restriction. I spoke to his wife about this on the phone this am.  -He can be started back on Eliquis since he has been on this at home.   Lauree Chandler 9:37 AM 07/09/2018

## 2018-07-09 NOTE — Progress Notes (Signed)
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  Reportedly acutely hypoxic in the field.   Initially presented to [facility].  Chest x-ray reportedly with pulmonary congestion. CTA chest negative for PE but vascular congestion Troponin trended up from 0.32-0.62.  Was given full dose aspirin and started on heparin drip.  Required up to 6 L by nasal cannula at [facility] but weaned down quickly after Nitropaste and Lasix.  Transferred to [facility] for end STEMI and CHF exacerbation.  Patient continued on heparin drip and IV Lasix.  The later was held due to AKI.  Echocardiogram with EF of 45%, moderate concentric LVH, diastolic dysfunction and no other significant structural functional abnormalities.  Renal duplex not impressive.  ABI consistent with mild PAD.  Subjective: No major events overnight of this morning.  He denies dyspnea, chest pain, orthopnea or PND.  Denies GI or GU symptoms.  Ambulated with PT on room air without an issue.  About a liter urine output.  Renal function improved.  Wife asking for dietary consultation.   Objective: Vitals:   07/08/18 2052 07/09/18 0500 07/09/18 0507 07/09/18 0904  BP:   (!) 141/62 (!) 157/71  Pulse: 62   70  Resp: 18     Temp: 98.4 F (36.9 C) 98.6 F (37 C)    TempSrc:  Oral    SpO2: 97%  96%   Weight:  (!) 138.3 kg    Height:        Intake/Output Summary (Last 24 hours) at 07/09/2018 1320 Last data filed at 07/08/2018 1836 Gross per 24 hour  Intake -  Output 600 ml  Net -600 ml   Filed Weights   07/07/18 0447 07/08/18 0500 07/09/18 0500   Weight: (!) 138.7 kg (!) 139.5 kg (!) 138.3 kg    Examination:  GENERAL: No acute distress.  Appears well.  HEENT: MMM.  Vision and hearing grossly intact.  NECK: Supple.  No apparent JVD LUNGS:  No IWOB. Good air movement bilaterally. HEART:  RRR. Heart sounds normal.  ABD: Bowel sounds present. Soft. Non tender.  MSK/EXT:  Moves all extremities. No apparent deformity.  1+ edema bilaterally SKIN: no apparent skin lesion or wound NEURO: Awake, alert and oriented appropriately.  No gross deficit.  PSYCH: Calm. Normal affect.  I have personally reviewed the following labs and images: CBC: Recent Labs  Lab 07/05/18 2006 07/06/18 0705 07/07/18 1105 07/08/18 0614 07/09/18 0425  WBC 10.8* 14.9* 12.1* 10.7* 10.8*  NEUTROABS 9.6*  --   --   --   --   HGB 13.6 12.5* 10.8* 10.6* 10.8*  HCT 41.3 38.0* 32.6* 32.0* 32.3*  MCV 88.8 90.3 89.3 87.4 88.7  PLT 254 231 201 196 703   Basic Metabolic Panel: Recent Labs  Lab 07/05/18 2006 07/06/18 0705 07/07/18 1105 07/07/18 1219 07/08/18 0614 07/09/18 0425  NA 137 135  --  133* 134* 135  K 4.5 4.6  --  4.4 4.4 4.6  CL 105 100  --  100 103 103  CO2 22 19*  --  21* 21*  23  GLUCOSE 240* 315*  --  214* 167* 159*  BUN 29* 38*  --  65* 76* 76*  CREATININE 2.14* 2.65*  --  4.42* 4.58* 3.91*  CALCIUM 8.9 8.7*  --  8.3* 8.0* 8.3*  MG 1.7  --  1.7  --  1.8 1.9   GFR: Estimated Creatinine Clearance: 25.7 mL/min (A) (by C-G formula based on SCr of 3.91 mg/dL (H)). Liver Function Tests: Recent Labs  Lab 07/03/18 0813 07/05/18 2006  AST 28 27  ALT 24 28  ALKPHOS  --  104  BILITOT  --  0.7  PROT  --  6.6  ALBUMIN  --  3.0*   No results for input(s): LIPASE, AMYLASE in the last 168 hours. No results for input(s): AMMONIA in the last 168 hours. Coagulation Profile: Recent Labs  Lab 07/05/18 2006  INR 1.1   Cardiac Enzymes: No results for input(s): CKTOTAL, CKMB, CKMBINDEX, TROPONINI in the last 168 hours. BNP (last 3 results)  No results for input(s): PROBNP in the last 8760 hours. HbA1C: No results for input(s): HGBA1C in the last 72 hours. CBG: Recent Labs  Lab 07/08/18 1135 07/08/18 1633 07/08/18 2206 07/09/18 0823 07/09/18 1222  GLUCAP 176* 162* 174* 179* 286*   Lipid Profile: No results for input(s): CHOL, HDL, LDLCALC, TRIG, CHOLHDL, LDLDIRECT in the last 72 hours. Thyroid Function Tests: No results for input(s): TSH, T4TOTAL, FREET4, T3FREE, THYROIDAB in the last 72 hours. Anemia Panel: No results for input(s): VITAMINB12, FOLATE, FERRITIN, TIBC, IRON, RETICCTPCT in the last 72 hours. Urine analysis: No results found for: COLORURINE, APPEARANCEUR, LABSPEC, PHURINE, GLUCOSEU, HGBUR, BILIRUBINUR, KETONESUR, PROTEINUR, UROBILINOGEN, NITRITE, LEUKOCYTESUR Sepsis Labs: Invalid input(s): PROCALCITONIN, LACTICIDVEN  No results found for this or any previous visit (from the past 240 hour(s)).   Radiology Studies: No results found.  6/27: CTA chest at [facility] Cardiovascular: Mild cardiomegaly.  No pericardial effusion.  Dilated central pulmonary arteries suggesting pulmonary artery hypertension.  No evidence of pulmonary emboli. Impression: 1.  Negative for acute PE or thoracic aortic dissection. 2 tiny bilateral pleural effusions. 3 bilateral pulmonary airspace and Groundglass opacities favor edema/alveolitis over pneumonia 4.  Coronary and aortic atherosclerosis 5.  Dilated central pulmonary arteries suggesting pulmonary arterial hypertension.  Procedures:  6/27: Echocardiogram with EF of 45%, moderate concentric LVH, diastolic dysfunction and no other significant structural functional abnormalities.  Microbiology: None  Assessment & Plan: Acute hypoxemic respiratory failure: Likely due to CHF exacerbation as below.  PE excluded by CTA chest.  Resolved. -Manage CHF as below.  Acute on chronic combined CHF: presented with acute dyspnea, diaphoresis and hypoxemia. Likely due to dietary  indiscretion. Echo as above.  Symptoms improved with IV Lasix but had AKI.  Lasix on hold.  About 1 L urine output off Lasix.  No cardiopulmonary symptoms. -Cardiology managing -Lasix on hold due to AKI-plan to resume on 7/1 if renal function improves -Continue Coreg 25 mg twice daily, BiDil, low-dose Xarelto -Daily weight, intake output and renal functions -Continue salt restriction. -We will temporarily liberate his fluid intake to 2000 cc a day in the setting of AKI. -Closely monitor electrolytes and replenish aggressively. -Dietitian consulted  NSTEMI/history of STEMI s/p DES to LAD in 2017: troponin in gray zone.  Could be demand ischemia due to CHF.  Patient is chest pain-free.  EKG NSR with age-indeterminate infarcts. -Cardiac meds as above -Atorvastatin  AKI on CKD-3/azotemia: unclear baseline creatinine but in the range of 1.5-1.7 about 2 to 3 years ago.  Likely a combination of contrast, ARB and Lasix.  Denies NSAID use.  Renal duplex without critical stenosis.  Renal function improving now. -Lasix and losartan on hold due to AKI -Liberate fluid intake temporarily -Continue monitoring renal function  Mild Anion gap metabolic acidosis: Anion gap resolved.  Likely due to the above. -Follow morning labs  Suboptimally controlled IDDM-2 with hyperglycemia renal complication: C1U 3.8%. On Tresiba to 60 units and Victoza at home.  CBG within fair range. -Lantus 20 units twice daily -NovoLog 8 units 3 times daily -Continue SSI-moderate -Continue statin  History of CVA: thought to be due to LV thrombus after his prior MI complicated by cardiac arrest and low EF.  On Eliquis at home.  -On low-dose Xarelto-plan to discharge on Eliquis -Aspirin, statin and cardiac meds   Hypothyroidism: Stable -Continue home Synthroid  Morbid obesity: BMI 42.65 -Encourage lifestyle change to lose weight -On Victoza at home -Dietitian consulted.  ?Vascular dementia/anoxic encephalopathy?: thought  to be due to CVA.  Stable  Mild PAD: ABI and TBI showed mild PAD. -Aspirin and statin as above  Chronic cough/sinus issue: -Flonase nasal spray  DVT prophylaxis: On heparin drip Code Status: Full code Family Communication: Updated patient's wife over the phone Disposition Plan: Remains inpatient for significant AKI, and STEMI and CHF exacerbation. Consultants: Cardiology  Antimicrobials: Anti-infectives (From admission, onward)   None      Scheduled Meds: . apixaban  5 mg Oral BID  . atorvastatin  80 mg Oral q1800  . carvedilol  25 mg Oral BID WC  . hydrALAZINE  50 mg Oral Q8H  . insulin aspart  0-15 Units Subcutaneous TID WC  . insulin aspart  8 Units Subcutaneous TID WC  . insulin glargine  20 Units Subcutaneous BID  . isosorbide mononitrate  30 mg Oral Daily  . levothyroxine  25 mcg Oral Q0600  . pantoprazole  40 mg Oral Daily  . sodium chloride flush  3 mL Intravenous Q12H   Continuous Infusions: . sodium chloride     PRN Meds:.sodium chloride, acetaminophen, nitroGLYCERIN, ondansetron (ZOFRAN) IV, sodium chloride flush  Taye T. Fishers  If 7PM-7AM, please contact night-coverage [email or web address] Password [email or web address] 07/09/2018, 1:20 PM

## 2018-07-09 NOTE — Progress Notes (Signed)
OT Cancellation Note  Patient Details Name: Adam Carney MRN: [id number] DOB: 08/09/1950   Cancelled Treatment:    Reason Eval/Treat Not Completed: Other (comment). Pt currently working with PT. Plan to reattempt in a bit.  Tyrone Schimke, OT Acute Rehabilitation Services Pager: [phone] Office: [phone]  07/09/2018, 9:00 AM

## 2018-07-09 NOTE — Evaluation (Signed)
Occupational Therapy Evaluation Patient Details Name: Adam Carney MRN: [id number] DOB: 07/18/1950 Today's Date: 07/09/2018    History of Present Illness 68 y.o. male with a PMH of CAD s/p STEMI, anoxic brain injury, chronic combined CHF, CVA, HTN, HLD, CKD 3 and DM. Admitted with acute dyspnea and diaphoresis and admitted with CHF exacerbation and demand ischemia   Clinical Impression   Pt admitted with the above diagnoses and presents with below problem list. Pt will benefit from continued acute OT to address the below listed deficits and maximize independence with basic ADLs prior to d/c home. PTA pt was mod I to independent with ADLs. Pt currently setup to supervision with ADLs. Pt reports mild decreased activity tolerance compared to baseline. During next session plan to address energy conservation strategies for managing IADLs and BADLs at home.     Follow Up Recommendations  No OT follow up    Equipment Recommendations  None recommended by OT    Recommendations for Other Services       Precautions / Restrictions Precautions Precautions: None      Mobility Bed Mobility Overal bed mobility: Modified Independent             General bed mobility comments: up in chair  Transfers Overall transfer level: Modified independent                    Balance Overall balance assessment: No apparent balance deficits (not formally assessed)                                         ADL either performed or assessed with clinical judgement   ADL Overall ADL's : Needs assistance/impaired Eating/Feeding: Set up   Grooming: Set up   Upper Body Bathing: Set up   Lower Body Bathing: Set up;Supervison/ safety   Upper Body Dressing : Set up   Lower Body Dressing: Set up;Supervision/safety   Toilet Transfer: Supervision/safety   Toileting- Clothing Manipulation and Hygiene: Supervision/safety   Tub/ Shower Transfer: Supervision/safety   Functional  mobility during ADLs: Supervision/safety General ADL Comments: Pt completed in room functional mobility and stood to complete grooming task.     Vision         Perception     Praxis      Pertinent Vitals/Pain Pain Assessment: No/denies pain     Hand Dominance     Extremity/Trunk Assessment Upper Extremity Assessment Upper Extremity Assessment: Generalized weakness   Lower Extremity Assessment Lower Extremity Assessment: Generalized weakness   Cervical / Trunk Assessment Cervical / Trunk Assessment: Normal   Communication Communication Communication: No difficulties   Cognition Arousal/Alertness: Awake/alert Behavior During Therapy: WFL for tasks assessed/performed Overall Cognitive Status: Within Functional Limits for tasks assessed                                     General Comments       Exercises     Shoulder Instructions      Home Living Family/patient expects to be discharged to:: Private residence Living Arrangements: Spouse/significant other Available Help at Discharge: Family;Available 24 hours/day Type of Home: House Home Access: Stairs to enter [employer] of Steps: 1   Home Layout: Two level;Bed/bath upstairs Alternate Level Stairs-Number of Steps: 14   Bathroom Shower/Tub: Occupational psychologist:  Handicapped height     Home Equipment: [name] - 4 wheels;Cane - single point;Shower seat - built in          Prior Functioning/Environment Level of Independence: Independent        Comments: pt does the grocery shopping and more housework of late as wife has CA and is undergoing radiation        OT Problem List: Decreased activity tolerance;Impaired balance (sitting and/or standing);Decreased knowledge of use of DME or AE;Decreased knowledge of precautions      OT Treatment/Interventions: Energy conservation;Therapeutic activities;DME and/or AE instruction;Self-care/ADL training    OT  Goals(Current goals can be found in the care plan section) Acute Rehab OT Goals Patient Stated Goal: return home to the chickens and garden OT Goal Formulation: With patient Time For Goal Achievement: 07/16/18 Potential to Achieve Goals: Good ADL Goals Pt Will Perform Grooming: with modified independence;standing Additional ADL Goal #1: Pt will independently verbalize 2 energy conservation strategies to incoporate into ADLs.  OT Frequency: Min 1X/week   Barriers to D/C:            Co-evaluation              AM-PAC OT "6 Clicks" Daily Activity     Outcome Measure Help from another person eating meals?: None Help from another person taking care of personal grooming?: None Help from another person toileting, which includes using toliet, bedpan, or urinal?: None Help from another person bathing (including washing, rinsing, drying)?: None Help from another person to put on and taking off regular upper body clothing?: None Help from another person to put on and taking off regular lower body clothing?: None 6 Click Score: 24   End of Session    Activity Tolerance: Patient tolerated treatment well Patient left: in chair;with call bell/phone within reach  OT Visit Diagnosis: Unsteadiness on feet (R26.81);Muscle weakness (generalized) (M62.81)                Time: 1157-2620 OT Time Calculation (min): 9 min Charges:  OT General Charges $OT Visit: 1 Visit OT Evaluation $OT Eval Low Complexity: [place], OT Acute Rehabilitation Services Pager: [phone] Office: [phone]   Hortencia Pilar 07/09/2018, 1:30 PM

## 2018-07-10 LAB — BASIC METABOLIC PANEL
Anion gap: 12 (ref 5–15)
BUN: 70 mg/dL — ABNORMAL HIGH (ref 8–23)
CO2: 18 mmol/L — ABNORMAL LOW (ref 22–32)
Calcium: 8.3 mg/dL — ABNORMAL LOW (ref 8.9–10.3)
Chloride: 106 mmol/L (ref 98–111)
Creatinine, Ser: 3.16 mg/dL — ABNORMAL HIGH (ref 0.61–1.24)
GFR calc Af Amer: 22 mL/min — ABNORMAL LOW (ref >60)
GFR calc non Af Amer: 19 mL/min — ABNORMAL LOW (ref >60)
Glucose, Bld: 143 mg/dL — ABNORMAL HIGH (ref 70–99)
Potassium: 4.5 mmol/L (ref 3.5–5.1)
Sodium: 136 mmol/L (ref 135–145)

## 2018-07-10 LAB — CBC
HCT: 31.7 % — ABNORMAL LOW (ref 39.0–52.0)
Hemoglobin: 10.7 g/dL — ABNORMAL LOW (ref 13.0–17.0)
MCH: 29.6 pg (ref 26.0–34.0)
MCHC: 33.8 g/dL (ref 30.0–36.0)
MCV: 87.8 fL (ref 80.0–100.0)
Platelets: 178 10*3/uL (ref 150–400)
RBC: 3.61 MIL/uL — ABNORMAL LOW (ref 4.22–5.81)
RDW: 14.5 % (ref 11.5–15.5)
WBC: 9.5 10*3/uL (ref 4.0–10.5)
nRBC: 0 % (ref 0.0–0.2)

## 2018-07-10 LAB — MAGNESIUM: Magnesium: 1.9 mg/dL (ref 1.7–2.4)

## 2018-07-10 LAB — GLUCOSE, CAPILLARY
Glucose-Capillary: 134 mg/dL — ABNORMAL HIGH (ref 70–99)
Glucose-Capillary: 209 mg/dL — ABNORMAL HIGH (ref 70–99)

## 2018-07-10 MED ORDER — FUROSEMIDE 20 MG PO TABS: 40.0000 mg | ORAL_TABLET | Freq: Every day | ORAL | 0 refills | Status: DC

## 2018-07-10 MED ORDER — TRESIBA FLEXTOUCH 100 UNIT/ML ~~LOC~~ SOPN: 50.0000 [IU] | PEN_INJECTOR | SUBCUTANEOUS | Status: AC

## 2018-07-10 MED ORDER — ISOSORBIDE MONONITRATE ER 30 MG PO TB24: 30.0000 mg | ORAL_TABLET | Freq: Every day | ORAL | 0 refills | Status: DC

## 2018-07-10 MED ORDER — HYDRALAZINE HCL 50 MG PO TABS: 50.0000 mg | ORAL_TABLET | Freq: Three times a day (TID) | ORAL | 0 refills | Status: DC

## 2018-07-10 NOTE — Discharge Summary (Signed)
Physician Discharge Summary  Adam Carney QBH:[id number] DOB: Oct 05, 1950 DOA: 07/05/2018  PCP: Martinique, Sarah T, MD  Admit date: 07/05/2018 Discharge date: 07/10/2018  Admitted From: home Disposition:  home  Recommendations for Outpatient Follow-up:  1. Follow up with PCP in 1-2 weeks 2. Please obtain BMP/CBC in one week 3. 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 Reportedly acutely hypoxic in the field.   Initially presented to [facility].  Chest x-ray reportedly with pulmonary congestion. CTA chest negative for PE but vascular congestion Troponin trended up from 0.32-0.62.  Was given full dose aspirin and started on heparin drip.  Required up to 6 L by nasal cannula at [facility] but weaned down quickly after Nitropaste and Lasix.  Transferred to [facility] for end STEMI and CHF exacerbation.  Patient continued on heparin drip and IV Lasix.  The later was held due to AKI.  Echocardiogram with EF of 45%, moderate concentric LVH, diastolic dysfunction and no other significant structural functional abnormalities.  Renal duplex not impressive.  ABI consistent with mild PAD  Patient was admitted.  Got iv lasix for chf exacerbation. He has now clinically improved.  He is off oxygen no PE on the CTA.  Patient managed for CHF seen by cardiology at this time stopped losartan due to AKI likely from contrast induced.  Held Lasix plan to resume tomorrow. Since this morning he was feeling well resting comfortably on the bedside chair eager to go home.   Discussed with the cardiology team was going to follow-up with him very soon and discuss about resuming losartan and also blood work-up for renal function.  Patient will be placed on diuretics from tomorrow.  Discharge Diagnoses:   Acute hypoxemic respiratory failure: Likely due to CHF exacerbation as below.  PE excluded by CTA chest. On RA  Acute on chronic combined CHF: presented with acute dyspnea, diaphoresis and hypoxemia. Likely due to dietary indiscretion. Echo was obtained.  Symptoms improved with IV Lasix but had AKI.  Lasix on hold.Continue Coreg 25 mg twice daily, BiDil, home anticoagulation. Seen by cardiology continue to hold losartan and resume Lasix from tomorrow.  As creatinine is slowly improving.  We will follow-up with cardiology to address home medications and also repeat blood work for kidney function.  NSTEMI/history of STEMI s/p DES to LAD in 2017: troponin in gray zone.  Could be demand ischemia due to CHF.  Patient is chest pain-free.  EKG NSR with age-indeterminate infarcts. CONT HOME MEDS  AKI on CKD-3/azotemia: unclear baseline creatinine but in the range of 1.5-1.7 about 2 to 3 years ago.  Likely a combination of contrast, ARB and Lasix.  Denies NSAID use.  Renal duplex without critical stenosis.  Renal function improving now. -Lasix and losartan on hold due to AKI- lasix resume 7/2, repeat bmet form cardio in a week  Mild Anion gap metabolic acidosis: resolved.   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 On Tresiba to 60 units and Victoza at home.  CBG within fair range.  While he has renal failure we have  cut down his Lantus and so I discussed with the patient in great detail, will slowly increase his Lantus after doing pre-meal Accu-Chek at home.  We will follow-up with his PCP/endocrinologist.  He reported he does have endocrine follow-up soon.  History of CVA: thought to be due to LV thrombus after his prior MI  complicated by cardiac arrest and low EF.  On Eliquis at home. Cont DOAC, creat cl >30   Hypothyroidism: Stable. Continue home Synthroid  Morbid obesity: BMI 42.65:Encourage lifestyle change to lose weight.On Victoza at home. Dietitian consulted.  ?Vascular dementia/anoxic encephalopathy?: thought to be due to CVA.  Stable  Mild PAD: ABI and TBI showed mild PAD. Aspirin and statin as above  Chronic cough/sinus issue: Flonase nasal spray  Discharge Instructions  Discharge Instructions    (HEART FAILURE PATIENTS) Call MD:  Anytime you have any of the following symptoms: 1) 3 pound weight gain in 24 hours or 5 pounds in 1 week 2) shortness of breath, with or without a dry hacking cough 3) swelling in the hands, feet or stomach 4) if you have to sleep on extra pillows at night in order to breathe.   Complete by: As directed    Diet - low sodium heart healthy   Complete by: As directed    Discharge instructions   Complete by: As directed    .Please call call MD or return to ER for similar or worsening recurring problem that brought you to hospital or if any fever,nausea/vomiting,abdominal pain, uncontrolled pain, chest pain,  shortness of breath or any other alarming symptoms. Please follow-up your doctor as instructed in a week time and call the office for appointment.  Check blood sugar 3 times a day and bedtime at home. If blood sugar running above 200 less than 70 please call your MD to adjust insulin. If blood sugars running less 100 do not use insulin and call MD. If you noticed signs and symptoms of hypoglycemia or low blood sugar like jitteriness, confusion, thirst, tremor, sweating- Check blood sugar, drink sugary drink/biscuits/sweets to increase sugar level and call MD or return to ER.  Please avoid alcohol, smoking, or any other illicit substance and maintain healthy habits including taking your regular medications as prescribed.    You were cared for by a hospitalist  during your hospital stay. If you have any questions about your discharge medications or the care you received while you were in the hospital after you are discharged, you can call the unit and asked to speak with the hospitalist on call if the hospitalist that took care of you is not available. Once you are discharged, your primary care physician will handle any further medical issues. Please note that NO REFILLS for any discharge medications will be authorized once you are discharged, as it is imperative that you return to your primary care physician (or establish a relationship with a primary care physician if you do not have one) for your aftercare needs so that they can reassess your need for medications and monitor your lab values   Heart Failure patients record your daily weight using the same scale at the same time of day   Complete by: As directed    Increase activity slowly   Complete by: As directed      Allergies as of 07/10/2018      Reactions   Zetia [ezetimibe] Diarrhea   Lisinopril Cough      Medication List    STOP taking these medications  losartan 100 MG tablet Commonly known as: COZAAR   losartan 50 MG tablet Commonly known as: COZAAR     TAKE these medications   albuterol 108 (90 Base) MCG/ACT inhaler Commonly known as: VENTOLIN HFA Inhale 2 puffs into the lungs every 6 (six) hours as needed for wheezing or shortness of breath.   apixaban 5 MG Tabs tablet Commonly known as: Eliquis Take 1 tablet (5 mg total) 2 (two) times daily by mouth.   atorvastatin 80 MG tablet Commonly known as: LIPITOR TAKE 1 TABLET BY MOUTH EVERY DAY   carvedilol 25 MG tablet Commonly known as: COREG TAKE 1 TABLET BY MOUTH TWICE A DAY What changed: when to take this   CINNAMON PO Take 1,000 mg by mouth daily.   fluticasone 50 MCG/ACT nasal spray Commonly known as: FLONASE Place 1 spray into both nostrils daily.   furosemide 20 MG tablet Commonly known as: LASIX Take 2  tablets (40 mg total) by mouth daily. Start taking on: July 11, 2018 What changed:   how much to take  when to take this   gabapentin 300 MG capsule Commonly known as: NEURONTIN Take 300 mg by mouth 3 (three) times daily as needed (severe back pain).   hydrALAZINE 50 MG tablet Commonly known as: APRESOLINE Take 1 tablet (50 mg total) by mouth every 8 (eight) hours.   insulin aspart 100 UNIT/ML FlexPen Commonly known as: NOVOLOG Inject 18-42 Units into the skin 3 (three) times daily with meals. Per sliding scale based on CBG   isosorbide mononitrate 30 MG 24 hr tablet Commonly known as: IMDUR Take 1 tablet (30 mg total) by mouth daily. Start taking on: July 11, 2018   levothyroxine 25 MCG tablet Commonly known as: SYNTHROID Take 25 mcg by mouth daily.   loratadine 10 MG tablet Commonly known as: CLARITIN Take 10 mg by mouth daily.   omeprazole 20 MG capsule Commonly known as: PRILOSEC TAKE 1 CAPSULE BY MOUTH EVERY DAY What changed: how much to take   [place] FlexTouch 100 UNIT/ML Sopn FlexTouch Pen Generic drug: insulin degludec Inject 0.5-0.6 mLs (50-60 Units total) into the skin See admin instructions. Please use only half about 20-30 units daily for few days while you have kidney failure. And increase slowly to home dose- call you MD What changed: additional instructions   Victoza 18 MG/3ML Sopn Generic drug: liraglutide Inject 1.8 mg into the skin at bedtime.   Vitamin C Powd Take 1,000 mg by mouth daily. Mix in water and drink      Follow-up Information    Martinique, Sarah T, MD Follow up in 1 week(s).   Specialty: Family Medicine Contact information: [address] [place] [address] [phone]        Park Liter, MD Follow up in 1 week(s).   Specialty: Cardiology Why: call office Contact information: [address] [place] [address] [phone]          Allergies  Allergen Reactions  . Zetia [Ezetimibe] Diarrhea  .  Lisinopril Cough    Consultations: Cardiology  Procedures/Studies: Dg Chest Port 1 View  Result Date: 07/05/2018 CLINICAL DATA:  Congestive heart failure. EXAM: PORTABLE CHEST 1 VIEW COMPARISON:  July 05, 2018 FINDINGS: Bibasilar airspace opacities are again noted. The heart size is stable from prior study. There is no pneumothorax. No large pleural effusion. There are small bilateral pleural effusions. Mild aortic calcifications are noted. There is no acute osseous abnormality. IMPRESSION: Again noted are findings consistent with  pulmonary edema as previously described. No significant oval change from x-ray earlier today. Electronically Signed   By: Constance Holster M.D.   On: 07/05/2018 20:13   Vas [place] Burnard Bunting With/wo Tbi  Result Date: 07/08/2018 LOWER EXTREMITY DOPPLER STUDY Indications: Claudication.  Limitations: Patient body habitus Comparison Study: 12/26/16 - R 1.0 L 0.88 Performing Technologist: Carlos Levering Rvt  Examination Guidelines: A complete evaluation includes at minimum, Doppler waveform signals and systolic blood pressure reading at the level of bilateral brachial, anterior tibial, and posterior tibial arteries, when vessel segments are accessible. Bilateral testing is considered an integral part of a complete examination. Photoelectric Plethysmograph (PPG) waveforms and toe systolic pressure readings are included as required and additional duplex testing as needed. Limited examinations for reoccurring indications may be performed as noted.  ABI Findings: +---------+------------------+-----+---------+--------+ Right    Rt Pressure (mmHg)IndexWaveform Comment  +---------+------------------+-----+---------+--------+ Brachial 129                    triphasic         +---------+------------------+-----+---------+--------+ PTA      113               0.88 triphasic         +---------+------------------+-----+---------+--------+ DP       84                0.65 triphasic          +---------+------------------+-----+---------+--------+ Great Toe79                0.61                   +---------+------------------+-----+---------+--------+ +---------+------------------+-----+---------+-------+ Left     Lt Pressure (mmHg)IndexWaveform Comment +---------+------------------+-----+---------+-------+ Brachial 121                    triphasic        +---------+------------------+-----+---------+-------+ PTA      254               1.97 triphasic        +---------+------------------+-----+---------+-------+ DP       103               0.80 triphasic        +---------+------------------+-----+---------+-------+ Great Toe73                0.57                  +---------+------------------+-----+---------+-------+ +-------+-----------+-----------+------------+------------+ ABI/TBIToday's ABIToday's TBIPrevious ABIPrevious TBI +-------+-----------+-----------+------------+------------+ Right  0.88       0.61       1.0                      +-------+-----------+-----------+------------+------------+ Left   0.8        0.57       0.88                     +-------+-----------+-----------+------------+------------+  Summary: Right: Resting right ankle-brachial index indicates mild right lower extremity arterial disease. The right toe-brachial index is abnormal. Left: Resting left ankle-brachial index indicates mild left lower extremity arterial disease. The left toe-brachial index is abnormal.  *See table(s) above for measurements and observations.  Electronically signed by Monica Martinez MD on 07/08/2018 at 3:20:36 PM.   Final    Vas US Renal Artery Duplex  Result Date: 07/08/2018 ABDOMINAL VISCERAL Indications: Flash pulmonary edema High Risk Factors: Hypertension, Diabetes. Limitations: Obesity and air/bowel gas.  Comparison Study: No prior studies. Performing Technologist: Oliver Hum RVT  Examination Guidelines: A complete evaluation  includes B-mode imaging, spectral Doppler, color Doppler, and power Doppler as needed of all accessible portions of each vessel. Bilateral testing is considered an integral part of a complete examination. Limited examinations for reoccurring indications may be performed as noted.  Duplex Findings: +--------------------+--------+--------+------+-------------------+ Mesenteric          PSV cm/sEDV cm/sPlaque     Comments       +--------------------+--------+--------+------+-------------------+ Aorta Distal          136      22                             +--------------------+--------+--------+------+-------------------+ Celiac Artery Origin                      Unable to visualize +--------------------+--------+--------+------+-------------------+ SMA Origin                                Unable to visualize +--------------------+--------+--------+------+-------------------+  +------------------+--------+--------+-------+ Right Renal ArteryPSV cm/sEDV cm/sComment +------------------+--------+--------+-------+ Origin               90      12           +------------------+--------+--------+-------+ Proximal            114      14           +------------------+--------+--------+-------+ Mid                  79      10           +------------------+--------+--------+-------+ Distal               72      12           +------------------+--------+--------+-------+ +-----------------+--------+--------+-------+ Left Renal ArteryPSV cm/sEDV cm/sComment +-----------------+--------+--------+-------+ Origin              36      7            +-----------------+--------+--------+-------+ Proximal            23      8            +-----------------+--------+--------+-------+ Mid                 26      10           +-----------------+--------+--------+-------+ Distal              25      5            +-----------------+--------+--------+-------+ Technologist  observations Renal Artery(s):There is a 4cm length disparity between the right and left kidney. +------------+--------+--------+----+-----------+--------+--------+----+ Right KidneyPSV cm/sEDV cm/sRI  Left KidneyPSV cm/sEDV cm/sRI   +------------+--------+--------+----+-----------+--------+--------+----+ Upper Pole  31      6       0.79Upper Pole 20      6       0.70 +------------+--------+--------+----+-----------+--------+--------+----+ Mid         29      7       0.74Mid        19      7       0.63 +------------+--------+--------+----+-----------+--------+--------+----+ Lower Pole  24      3  0.86Lower Pole 19      5       0.72 +------------+--------+--------+----+-----------+--------+--------+----+ Hilar       76      12      0.84Hilar      25      7       0.72 +------------+--------+--------+----+-----------+--------+--------+----+ +------------------+-----+------------------+-----+ Right Kidney           Left Kidney             +------------------+-----+------------------+-----+ RAR                    RAR                     +------------------+-----+------------------+-----+ RAR (manual)      0.84 RAR (manual)      0.26  +------------------+-----+------------------+-----+ Cortex                 Cortex                  +------------------+-----+------------------+-----+ Cortex thickness       Corex thickness         +------------------+-----+------------------+-----+ Kidney length (cm)15.00Kidney length (cm)11.00 +------------------+-----+------------------+-----+  Summary: Renal:  Right: Normal right Resisitive Index. 1-59% stenosis of the right        renal artery. Left:  Normal left Resistive Index. 1-59% stenosis of the left renal        artery.  *See table(s) above for measurements and observations.  Diagnosing physician: Monica Martinez MD  Electronically signed by Monica Martinez MD on 07/08/2018 at 3:18:50 PM.    Final      (Echo, Carotid, EGD, Colonoscopy, ERCP)   Discharge Exam: Vitals:   07/09/18 2211 07/10/18 0602  BP: 138/64 (!) 134/55  Pulse: 61 61  Resp: 20 20  Temp: 97.7 F (36.5 C) 98.4 F (36.9 C)  SpO2: 96% 96%   Vitals:   07/09/18 1500 07/09/18 1828 07/09/18 2211 07/10/18 0602  BP:  (!) 161/91 138/64 (!) 134/55  Pulse: 67 64 61 61  Resp:   20 20  Temp: 98.5 F (36.9 C)  97.7 F (36.5 C) 98.4 F (36.9 C)  TempSrc: Oral  Oral Oral  SpO2: 97%  96% 96%  Weight:      Height:        General: Pt is alert, awake, not in acute distress Cardiovascular: RRR, S1/S2 +, no rubs, no gallops Respiratory: CTA bilaterally, no wheezing, no rhonchi Abdominal: Soft, NT, ND, bowel sounds + Extremities: no edema, no cyanosis   The results of significant diagnostics from this hospitalization (including imaging, microbiology, ancillary and laboratory) are listed below for reference.     Microbiology: No results found for this or any previous visit (from the past 240 hour(s)).   Labs: BNP (last 3 results) Recent Labs    07/05/18 2006  BNP 300.9*   Basic Metabolic Panel: Recent Labs  Lab 07/05/18 2006 07/06/18 0705 07/07/18 1105 07/07/18 1219 07/08/18 0614 07/09/18 0425 07/10/18 0520  NA 137 135  --  133* 134* 135 136  K 4.5 4.6  --  4.4 4.4 4.6 4.5  CL 105 100  --  100 103 103 106  CO2 22 19*  --  21* 21* 23 18*  GLUCOSE 240* 315*  --  214* 167* 159* 143*  BUN 29* 38*  --  65* 76* 76* 70*  CREATININE 2.14* 2.65*  --  4.42* 4.58* 3.91* 3.16*  CALCIUM 8.9 8.7*  --  8.3* 8.0* 8.3* 8.3*  MG 1.7  --  1.7  --  1.8 1.9 1.9   Liver Function Tests: Recent Labs  Lab 07/05/18 2006  AST 27  ALT 28  ALKPHOS 104  BILITOT 0.7  PROT 6.6  ALBUMIN 3.0*   No results for input(s): LIPASE, AMYLASE in the last 168 hours. No results for input(s): AMMONIA in the last 168 hours. CBC: Recent Labs  Lab 07/05/18 2006 07/06/18 0705 07/07/18 1105 07/08/18 0614 07/09/18 0425  07/10/18 0520  WBC 10.8* 14.9* 12.1* 10.7* 10.8* 9.5  NEUTROABS 9.6*  --   --   --   --   --   HGB 13.6 12.5* 10.8* 10.6* 10.8* 10.7*  HCT 41.3 38.0* 32.6* 32.0* 32.3* 31.7*  MCV 88.8 90.3 89.3 87.4 88.7 87.8  PLT 254 231 201 196 201 178   Cardiac Enzymes: No results for input(s): CKTOTAL, CKMB, CKMBINDEX, TROPONINI in the last 168 hours. BNP: Invalid input(s): POCBNP CBG: Recent Labs  Lab 07/09/18 0823 07/09/18 1222 07/09/18 1716 07/09/18 2209 07/10/18 0710  GLUCAP 179* 286* 175* 171* 134*   D-Dimer No results for input(s): DDIMER in the last 72 hours. Hgb A1c No results for input(s): HGBA1C in the last 72 hours. Lipid Profile No results for input(s): CHOL, HDL, LDLCALC, TRIG, CHOLHDL, LDLDIRECT in the last 72 hours. Thyroid function studies No results for input(s): TSH, T4TOTAL, T3FREE, THYROIDAB in the last 72 hours.  Invalid input(s): FREET3 Anemia work up No results for input(s): VITAMINB12, FOLATE, FERRITIN, TIBC, IRON, RETICCTPCT in the last 72 hours. Urinalysis No results found for: COLORURINE, APPEARANCEUR, LABSPEC, [place], GLUCOSEU, HGBUR, BILIRUBINUR, KETONESUR, PROTEINUR, UROBILINOGEN, NITRITE, LEUKOCYTESUR Sepsis Labs Invalid input(s): PROCALCITONIN,  WBC,  LACTICIDVEN Microbiology No results found for this or any previous visit (from the past 240 hour(s)).   Time coordinating discharge: 35 minutes  SIGNED:   Antonieta Pert, MD  [facility] 07/10/2018, 9:06 AM  If 7PM-7AM, please contact night-coverage [email or web address]

## 2018-07-10 NOTE — Plan of Care (Signed)
  Problem: Clinical Measurements: Goal: Cardiovascular complication will be avoided Outcome: Progressing   Problem: Pain Managment: Goal: General experience of comfort will improve Outcome: Progressing   Problem: Safety: Goal: Ability to remain free from injury will improve Outcome: Progressing   

## 2018-07-10 NOTE — Plan of Care (Signed)
Nutrition Education Note  RD consulted for nutrition education regarding CHF.  **[place], answered a few diet related questions. Pt prefers RD send diet handouts via e-mail. E-mail address verified with patient.   RD to provide "Low Sodium Nutrition Therapy" handout from the Academy of [employer]. Reviewed patient's dietary recall. Provided examples on ways to decrease sodium intake in diet. Discouraged intake of processed foods and use of salt shaker. Encouraged fresh fruits and vegetables as well as whole grain sources of carbohydrates to maximize fiber intake.   RD discussed why it is important for patient to adhere to diet recommendations, and emphasized the role of fluids, foods to avoid, and importance of weighing self daily. Teach back method used.  Expect good compliance. Pt states his wife plans to help him.  Body mass index is 42.52 kg/m. Pt meets criteria for morbid obesity based on current BMI.  Current diet order is heart healthy/2GMNA, patient is consuming approximately 100% of meals at this time. Labs and medications reviewed. No further nutrition interventions warranted at this time. RD contact information provided. If additional nutrition issues arise, please re-consult RD.   Clayton Bibles, MS, RD, [place] Dietitian Pager: [phone] After Hours Pager: [phone]

## 2018-07-10 NOTE — Progress Notes (Signed)
Progress Note  Patient Name: Adam Carney Date of Encounter: 07/10/2018  Primary Cardiologist: Jenne Campus, MD   Subjective   Feels well, ready to go home.   Inpatient Medications    Scheduled Meds: . apixaban  5 mg Oral BID  . atorvastatin  80 mg Oral q1800  . carvedilol  25 mg Oral BID WC  . hydrALAZINE  50 mg Oral Q8H  . insulin aspart  0-15 Units Subcutaneous TID WC  . insulin aspart  8 Units Subcutaneous TID WC  . insulin glargine  20 Units Subcutaneous BID  . isosorbide mononitrate  30 mg Oral Daily  . levothyroxine  25 mcg Oral Q0600  . pantoprazole  40 mg Oral Daily  . sodium chloride flush  3 mL Intravenous Q12H   Continuous Infusions: . sodium chloride     PRN Meds: sodium chloride, acetaminophen, nitroGLYCERIN, ondansetron (ZOFRAN) IV, sodium chloride flush   Vital Signs    Vitals:   07/09/18 1500 07/09/18 1828 07/09/18 2211 07/10/18 0602  BP:  (!) 161/91 138/64 (!) 134/55  Pulse: 67 64 61 61  Resp:   20 20  Temp: 98.5 F (36.9 C)  97.7 F (36.5 C) 98.4 F (36.9 C)  TempSrc: Oral  Oral Oral  SpO2: 97%  96% 96%  Weight:      Height:        Intake/Output Summary (Last 24 hours) at 07/10/2018 0851 Last data filed at 07/10/2018 0542 Gross per 24 hour  Intake -  Output 1000 ml  Net -1000 ml   Last 3 Weights 07/09/2018 07/08/2018 07/07/2018  Weight (lbs) 304 lb 14.3 oz 307 lb 9.6 oz 305 lb 12.8 oz  Weight (kg) 138.3 kg 139.526 kg 138.71 kg      Telemetry    SR - Personally Reviewed  ECG    No new tracing this morning - Personally Reviewed  Physical Exam  Pleasant older WM, sitting up in the chair.  GEN: No acute distress.   Neck: No JVD Cardiac: RRR, no murmurs, rubs, or gallops.  Respiratory: Clear to auscultation bilaterally. GI: Soft, nontender, non-distended  MS: 1+ pretibial edema, No deformity. Neuro:  Nonfocal  Psych: Normal affect   Labs    High Sensitivity Troponin:   Recent Labs  Lab 07/05/18 2006 07/06/18 0705  07/06/18 1256  TROPONINIHS 531*  454* 316* 427*      Cardiac EnzymesNo results for input(s): TROPONINI in the last 168 hours. No results for input(s): TROPIPOC in the last 168 hours.   Chemistry Recent Labs  Lab 07/05/18 2006  07/08/18 0614 07/09/18 0425 07/10/18 0520  NA 137   < > 134* 135 136  K 4.5   < > 4.4 4.6 4.5  CL 105   < > 103 103 106  CO2 22   < > 21* 23 18*  GLUCOSE 240*   < > 167* 159* 143*  BUN 29*   < > 76* 76* 70*  CREATININE 2.14*   < > 4.58* 3.91* 3.16*  CALCIUM 8.9   < > 8.0* 8.3* 8.3*  PROT 6.6  --   --   --   --   ALBUMIN 3.0*  --   --   --   --   AST 27  --   --   --   --   ALT 28  --   --   --   --   ALKPHOS 104  --   --   --   --  BILITOT 0.7  --   --   --   --   GFRNONAA 31*   < > 12* 15* 19*  GFRAA 36*   < > 14* 17* 22*  ANIONGAP 10   < > 10 9 12    < > = values in this interval not displayed.     Hematology Recent Labs  Lab 07/08/18 0614 07/09/18 0425 07/10/18 0520  WBC 10.7* 10.8* 9.5  RBC 3.66* 3.64* 3.61*  HGB 10.6* 10.8* 10.7*  HCT 32.0* 32.3* 31.7*  MCV 87.4 88.7 87.8  MCH 29.0 29.7 29.6  MCHC 33.1 33.4 33.8  RDW 14.4 14.4 14.5  PLT 196 201 178    BNP Recent Labs  Lab 07/05/18 2006  BNP 546.7*     DDimer No results for input(s): DDIMER in the last 168 hours.   Radiology    No results found.  Cardiac Studies   TTE: 07/06/18  IMPRESSIONS   1. Stage 1: stage1: Multiple segmental abnormalities exist. See findings. 2. The left ventricle has a visually estimated ejection fraction of 45%. The cavity size was normal. There is moderate concentric left ventricular hypertrophy. Left ventricular diastolic Doppler parameters are consistent with pseudonormalization.  Elevated left ventricular end-diastolic pressure. 3. The right ventricle has normal systolic function. The cavity was normal. There is no increase in right ventricular wall thickness. 4. The mitral valve is degenerative. Mild thickening of the mitral  valve leaflet. There is mild mitral annular calcification present. 5. The tricuspid valve is grossly normal. 6. The aortic valve was not well visualized. Mild thickening of the aortic valve. 7. Probably trileaflet. 8. The aortic root is normal in size and structure. 9. The interatrial septum appears to be lipomatous.  FINDINGS Left Ventricle: The left ventricle has a visually estimated ejection fraction of 45. The cavity size was normal. There is moderate concentric left ventricular hypertrophy. Left ventricular diastolic Doppler parameters are consistent with  pseudonormalization. Elevated left ventricular end-diastolic pressure Definity contrast agent was given IV to delineate the left ventricular endocardial borders.  LV Wall Scoring: The apical septal segment, apical lateral segment, and apex are akinetic. The apical anterior segment and apical inferior segment are hypokinetic.   Right Ventricle: The right ventricle has normal systolic function. The cavity was normal. There is no increase in right ventricular wall thickness.  Left Atrium: Left atrial size was normal in size.  Right Atrium: Right atrial size was normal in size. Right atrial pressure is estimated at 10 mmHg.  Interatrial Septum: No atrial level shunt detected by color flow Doppler. Increased thickness of the atrial septum sparing the fossa ovalis consistent with The interatrial septum appears to be lipomatous.  Pericardium: There is no evidence of pericardial effusion.  Mitral Valve: The mitral valve is degenerative in appearance. Mild thickening of the mitral valve leaflet. There is mild mitral annular calcification present. Mitral valve regurgitation is mild by color flow Doppler.  Tricuspid Valve: The tricuspid valve is grossly normal. Tricuspid valve regurgitation is trivial by color flow Doppler.  Aortic Valve: The aortic valve was not well visualized Mild thickening of the aortic valve. Aortic  valve regurgitation was not visualized by color flow Doppler. There is no evidence of aortic valve stenosis. Probably trileaflet.  Pulmonic Valve: The pulmonic valve was not well visualized. Pulmonic valve regurgitation is not visualized by color flow Doppler.  Aorta: The aortic root is normal in size and structure.  Patient Profile     68 y.o. 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 Sherral Hammers.  Assessment & Plan    1. 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 Has been educated on diet, and fluid intake.Echo this admission showed moderate LVH with EF 45%, similar to prior. There is peri-apical akinesis consistent with known prior anterior MI.Renal artery dopplers showed only mild renal artery stenosis.Remains volume stable on exam. Auto-diuresed 1L yesterday. Cr 4.4>>4.5>>3.9>>3.1.  - Continue Coreg 25 mg bid,hydralazine 50 mg tid and Imdur to 60mg  daily. Would continue to hold ARB - resume lasix tomorrow.   2. UXY:BFXOVAN had anterior MI in 2017 with LAD PCI. This was complicated by cardiac arrest and anoxic injury. No chest pain. His flash pulmonary edema event could have been ischemia-mediated, though he reports significant Na+ intake abd fluid intake prior to admission.The troponin is mildly elevated butflat trend.Suspectdue to demand ischemia. No chest pain. - Continue ASAandatorvastatin.   3. AKI on CKD stage 3:His wife states that he has had CKD stage 3 for 3-4 years. Baseline unknown.Creatinine up from 2.65>>4.42>>4.58>>3.91>3.1. Suspect multifactorial with contrast for CTA PTA, states he was given dye twice as his IV infiltrated. Along with need for diuresis. Follow BMET -- renal doppler with mild RAS  4. H/o CVA:At time  of prior MI (complicated by cardiac arrest, CPR, prolonged hospitalization). He was anticoagulated due to low EF at the time and "substrate for LV thrombus"though does not appear this was confirmed.He has been on Eliquis at home.  - Dr. Aundra Dubin recommended to placeon ASA + low dose Xarelto (COMPASS regimen) rather than apixaban alone. Cost will be an issue with his out of pocket being >$100. Therefore switched back to Eliquis yesterday and ASA stopped.  5. H/o anoxic encephalopathy:Has memory trouble, but alert and oriented.   6. Leg pain:Dopplers with mild bilateral lower extremity arterial disease  7.IDDM: CBGs now controlled. Management per primary   [place] will sign off.   Medication Recommendations:  Resume lasix tomorrow Other recommendations (labs, testing, etc):  none Follow up as an outpatient:  TOC follow up, will arrange.   For questions or updates, please contact [place] Please consult [email or web address] for contact info under    Signed, Reino Bellis, NP  07/10/2018, 8:51 AM

## 2018-07-19 ENCOUNTER — Ambulatory Visit (INDEPENDENT_AMBULATORY_CARE_PROVIDER_SITE_OTHER): Payer: Medicare HMO | Admitting: Cardiology

## 2018-07-19 ENCOUNTER — Other Ambulatory Visit: Payer: Self-pay

## 2018-07-19 ENCOUNTER — Encounter: Payer: Self-pay | Admitting: Cardiology

## 2018-07-19 VITALS — BP 120/60 | HR 87 | Ht 71.0 in | Wt 292.8 lb

## 2018-07-19 DIAGNOSIS — I255 Ischemic cardiomyopathy: Secondary | ICD-10-CM

## 2018-07-19 DIAGNOSIS — N184 Chronic kidney disease, stage 4 (severe): Secondary | ICD-10-CM | POA: Diagnosis not present

## 2018-07-19 DIAGNOSIS — E118 Type 2 diabetes mellitus with unspecified complications: Secondary | ICD-10-CM | POA: Diagnosis not present

## 2018-07-19 DIAGNOSIS — I10 Essential (primary) hypertension: Secondary | ICD-10-CM

## 2018-07-19 MED ORDER — ISOSORBIDE MONONITRATE ER 30 MG PO TB24: 30.0000 mg | ORAL_TABLET | Freq: Every day | ORAL | 0 refills | Status: DC

## 2018-07-19 MED ORDER — CARVEDILOL 25 MG PO TABS: 25.0000 mg | ORAL_TABLET | Freq: Two times a day (BID) | ORAL | 0 refills | Status: DC

## 2018-07-19 NOTE — Patient Instructions (Signed)
Medication Instructions:  Your physician recommends that you continue on your current medications as directed. Please refer to the Current Medication list given to you today. If you need a refill on your cardiac medications before your next appointment, please call your pharmacy.   Lab work: Your physician recommends that you return for lab work today If you have labs (blood work) drawn today and your tests are completely normal, you will receive your results only by: Marland Kitchen MyChart Message (if you have MyChart) OR . A paper copy in the mail If you have any lab test that is abnormal or we need to change your treatment, we will call you to review the results.  Testing/Procedures: None   Follow-Up: At [facility] you and your health needs are our priority.  As part of our continuing mission to provide you with exceptional heart care, we have created designated Provider Care Teams.  These Care Teams include your primary Cardiologist (physician) and Advanced Practice Providers (APPs -  Physician Assistants and Nurse Practitioners) who all work together to provide you with the care you need, when you need it. . You will need a follow up appointment in 1 months.   Any Other Special Instructions Will Be Listed Below (If Applicable). N/A

## 2018-07-19 NOTE — Progress Notes (Signed)
Cardiology Office Note:    Date:  07/19/2018   ID:  Adam Carney, DOB Oct 14, 1950, MRN [id number]  PCP:  Carney, Adam T, MD  Cardiologist:  Jenne Campus, MD    Referring MD: Carney, Adam T, MD   Chief Complaint  Patient presents with  . Hospitalization Follow-up  Doing much better, I was in the hospital  History of Present Illness:    Adam Carney is a 68 y.o. 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 Reportedly acutely hypoxic in the field.  That was 4 years ago.  This time he came to [facility] because of shortness of breath he was eventually transferred to [facility] -  because borderline troponin I.  He was managed appropriately.  PE was ruled out.  He has minimal elevation of troponin however no ischemia work-up were pursued this time.  He was given diuretics and recovered quite nicely and the biggest obstacle that we faced that was the fact that he have kidney dysfunction.  So he is a diuretic has been cut down his ARB has been withdrawn.  He is on hydralazine as well as well as long-acting nitrates which I will continue.  He comes today to my office for follow-up overall doing well feeling actually much better.  He does have 15 chickens and take care of them with no difficulties.  Denies having any swelling of lower extremities no chest pain tightness squeezing pressure burning chest   Past Medical History:  Diagnosis Date  . Diabetes mellitus without complication ([place])   . Hyperlipidemia   . Hypertension     Past Surgical History:  Procedure Laterality Date  . CORONARY ANGIOPLASTY      Current Medications: Current Meds  Medication Sig  . albuterol (VENTOLIN HFA) 108 (90 Base) MCG/ACT inhaler Inhale 2 puffs into the lungs every 6 (six) hours as needed  for wheezing or shortness of breath.  Marland Kitchen apixaban (ELIQUIS) 5 MG TABS tablet Take 1 tablet (5 mg total) 2 (two) times daily by mouth.  . Ascorbic Acid (VITAMIN C) POWD Take 1,000 mg by mouth daily. Mix in water and drink  . atorvastatin (LIPITOR) 80 MG tablet TAKE 1 TABLET BY MOUTH EVERY DAY (Patient taking differently: Take 80 mg by mouth daily. )  . carvedilol (COREG) 25 MG tablet TAKE 1 TABLET BY MOUTH TWICE A DAY (Patient taking differently: Take 25 mg by mouth 2 (two) times daily with a meal. )  . CINNAMON PO Take 1,000 mg by mouth daily.  . fluticasone (FLONASE) 50 MCG/ACT nasal spray Place 1 spray into both nostrils daily.  . furosemide (LASIX) 20 MG tablet Take 2 tablets (40 mg total) by mouth daily.  Marland Kitchen gabapentin (NEURONTIN) 300 MG capsule Take 300 mg by mouth 3 (three) times daily as needed (severe back pain).   . hydrALAZINE (APRESOLINE) 50 MG tablet Take 1 tablet (50 mg total) by mouth every 8 (eight) hours.  . insulin aspart (NOVOLOG) 100 UNIT/ML FlexPen Inject 18-42 Units into the skin 3 (three) times daily with meals. Per sliding scale based on CBG  . insulin degludec (TRESIBA FLEXTOUCH) 100 UNIT/ML SOPN FlexTouch Pen Inject 0.5-0.6 mLs (50-60 Units total) into the skin See admin instructions. Please use only half about 20-30 units daily for  few days while you have kidney failure. And increase slowly to home dose- call you MD  . isosorbide mononitrate (IMDUR) 30 MG 24 hr tablet Take 1 tablet (30 mg total) by mouth daily.  Marland Kitchen levothyroxine (SYNTHROID) 25 MCG tablet Take 25 mcg by mouth daily.  Marland Kitchen liraglutide (VICTOZA) 18 MG/3ML SOPN Inject 1.8 mg into the skin at bedtime.   Marland Kitchen loratadine (CLARITIN) 10 MG tablet Take 10 mg by mouth daily.  Marland Kitchen omeprazole (PRILOSEC) 20 MG capsule TAKE 1 CAPSULE BY MOUTH EVERY DAY (Patient taking differently: Take 20 mg by mouth daily. )     Allergies:   Zetia [ezetimibe] and Lisinopril   Social History   Socioeconomic History  . Marital status: Married     Spouse name: Not on file  . Number of children: Not on file  . Years of education: Not on file  . Highest education level: Not on file  Occupational History  . Not on file  Social Needs  . Financial resource strain: Not very hard  . Food insecurity    Worry: Never true    Inability: Never true  . Transportation needs    Medical: No    Non-medical: No  Tobacco Use  . Smoking status: Never Smoker  . Smokeless tobacco: Never Used  Substance and Sexual Activity  . Alcohol use: No  . Drug use: No  . Sexual activity: Not on file  Lifestyle  . Physical activity    Days per week: 7 days    Minutes per session: 60 min  . Stress: Not at all  Relationships  . Social connections    Talks on phone: More than three times a week    Gets together: Twice a week    Attends religious service: More than 4 times per year    Active member of club or organization: No    Attends meetings of clubs or organizations: Never    Relationship status: Married  Other Topics Concern  . Not on file  Social History Narrative  . Not on file     Family History: The patient's family history includes AAA (abdominal aortic aneurysm) in his father; Diabetes in his mother; Hyperlipidemia in his mother; Hypertension in his mother. ROS:   Please see the history of present illness.    All 14 point review of systems negative except as described per history of present illness  EKGs/Labs/Other Studies Reviewed:      Recent Labs: 07/05/2018: ALT 28; B Natriuretic Peptide 546.7; TSH 1.476 07/10/2018: BUN 70; Creatinine, Ser 3.16; Hemoglobin 10.7; Magnesium 1.9; Platelets 178; Potassium 4.5; Sodium 136  Recent Lipid Panel    Component Value Date/Time   CHOL 120 07/03/2018 0813   TRIG 79 07/03/2018 0813   HDL 34 (L) 07/03/2018 0813   CHOLHDL 3.5 07/03/2018 0813   LDLCALC 70 07/03/2018 0813    Physical Exam:    VS:  BP 120/60   Pulse 87   Ht 5\' 11"  (1.803 m)   Wt 292 lb 12.8 oz (132.8 kg)   SpO2 98%    BMI 40.84 kg/m     Wt Readings from Last 3 Encounters:  07/19/18 292 lb 12.8 oz (132.8 kg)  07/09/18 (!) 304 lb 14.3 oz (138.3 kg)  11/28/17 287 lb 3.2 oz (130.3 kg)     GEN:  Well nourished, well developed in no acute distress HEENT: Normal NECK: No JVD; No carotid bruits LYMPHATICS: No lymphadenopathy CARDIAC: RRR, no murmurs, no rubs, no gallops RESPIRATORY:  Clear to auscultation without rales, wheezing or rhonchi  ABDOMEN: Soft, non-tender, non-distended MUSCULOSKELETAL:  No edema; No deformity  SKIN: Warm and dry LOWER EXTREMITIES: no swelling NEUROLOGIC:  Alert and oriented x 3 PSYCHIATRIC:  Normal affect   ASSESSMENT:    1. Ischemic cardiomyopathy   2. Essential hypertension   3. Type 2 diabetes mellitus with complication (HCC)   4. CKD (chronic kidney disease), stage IV (HCC)    PLAN:    In order of problems listed above:  1. Ischemic cardiomyopathy.  On appropriate medications.  I will check Chem-7 today to see if it safe to continue present management.  Overall hemodynamically appears to be compensated 2. Essential hypertension blood pressure well controlled 3. Type 2 diabetes followed by internal medicine team 4. Chronic kidney failure Chem-7 will be checked today.  Overall he looks good.  We will continue present management.  I see him back in a month   Medication Adjustments/Labs and Tests Ordered: Current medicines are reviewed at length with the patient today.  Concerns regarding medicines are outlined above.  No orders of the defined types were placed in this encounter.  Medication changes: No orders of the defined types were placed in this encounter.   Signed, Park Liter, MD, [facility] 07/19/2018 9:57 AM    [place]

## 2018-07-19 NOTE — Addendum Note (Signed)
Addended by: Polly Cobia A on: 07/19/2018 10:20 AM   Modules accepted: Orders

## 2018-07-19 NOTE — Addendum Note (Signed)
Addended by: Polly Cobia A on: 07/19/2018 10:17 AM   Modules accepted: Orders

## 2018-07-20 LAB — BASIC METABOLIC PANEL
BUN/Creatinine Ratio: 17 (ref 10–24)
BUN: 37 mg/dL — ABNORMAL HIGH (ref 8–27)
CO2: 20 mmol/L (ref 20–29)
Calcium: 9.2 mg/dL (ref 8.6–10.2)
Chloride: 101 mmol/L (ref 96–106)
Creatinine, Ser: 2.24 mg/dL — ABNORMAL HIGH (ref 0.76–1.27)
GFR calc Af Amer: 34 mL/min/{1.73_m2} — ABNORMAL LOW (ref >59)
GFR calc non Af Amer: 29 mL/min/{1.73_m2} — ABNORMAL LOW (ref >59)
Glucose: 124 mg/dL — ABNORMAL HIGH (ref 65–99)
Potassium: 5.1 mmol/L (ref 3.5–5.2)
Sodium: 137 mmol/L (ref 134–144)

## 2018-07-22 ENCOUNTER — Telehealth: Payer: Self-pay

## 2018-07-22 NOTE — Telephone Encounter (Signed)
Left voice message with callback number for lab results.

## 2018-08-07 ENCOUNTER — Other Ambulatory Visit: Payer: Self-pay | Admitting: Cardiology

## 2018-08-07 MED ORDER — HYDRALAZINE HCL 50 MG PO TABS: 50.0000 mg | ORAL_TABLET | Freq: Three times a day (TID) | ORAL | 1 refills | Status: DC

## 2018-08-07 NOTE — Telephone Encounter (Signed)
Refill sent in

## 2018-08-07 NOTE — Telephone Encounter (Signed)
°*  STAT* If patient is at the pharmacy, call can be transferred to refill team.   1. Which medications need to be refilled? (please list name of each medication and dose if known) hydrALAZINE (APRESOLINE) 50 MG tablet   2. Which pharmacy/location (including street and city if local pharmacy) is medication to be sent to?  [id number] - [place], [place] - [address] [phone] (Phone) [phone] (Fax)    3. Do they need a 30 day or 90 day supply? 90 day

## 2018-08-08 ENCOUNTER — Other Ambulatory Visit: Payer: Medicare HMO

## 2018-08-16 ENCOUNTER — Ambulatory Visit (INDEPENDENT_AMBULATORY_CARE_PROVIDER_SITE_OTHER): Payer: Medicare HMO | Admitting: Cardiology

## 2018-08-16 ENCOUNTER — Ambulatory Visit: Payer: Medicare HMO | Admitting: Cardiology

## 2018-08-16 ENCOUNTER — Encounter: Payer: Self-pay | Admitting: Cardiology

## 2018-08-16 ENCOUNTER — Other Ambulatory Visit: Payer: Self-pay

## 2018-08-16 VITALS — BP 126/60 | HR 90 | Ht 71.0 in | Wt 294.2 lb

## 2018-08-16 DIAGNOSIS — I10 Essential (primary) hypertension: Secondary | ICD-10-CM

## 2018-08-16 DIAGNOSIS — I5022 Chronic systolic (congestive) heart failure: Secondary | ICD-10-CM

## 2018-08-16 DIAGNOSIS — N184 Chronic kidney disease, stage 4 (severe): Secondary | ICD-10-CM

## 2018-08-16 DIAGNOSIS — I255 Ischemic cardiomyopathy: Secondary | ICD-10-CM | POA: Diagnosis not present

## 2018-08-16 MED ORDER — ISOSORBIDE MONONITRATE ER 60 MG PO TB24: 60.0000 mg | ORAL_TABLET | Freq: Every day | ORAL | 0 refills | Status: DC

## 2018-08-16 MED ORDER — NITROGLYCERIN 0.4 MG SL SUBL: 0.4000 mg | SUBLINGUAL_TABLET | SUBLINGUAL | 11 refills | Status: AC | PRN

## 2018-08-16 NOTE — Progress Notes (Signed)
Cardiology Office Note:    Date:  08/16/2018   ID:  Adam Carney, DOB 10/01/1950, MRN [id number]  PCP:  Martinique, Sarah T, MD  Cardiologist:  Jenne Campus, MD    Referring MD: Martinique, Sarah T, MD   Chief Complaint  Patient presents with  . Follow-up  Doing well  History of Present Illness:    Adam Carney is a 68 y.o. 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 Reportedly acutely hypoxic in the field.  That was 4 years ago.    Recently he ended up coming to round of hospital because of shortness of breath and he was transferred to [facility].  He did have borderline troponin ice.  PE was ruled out.  He was also noted to have significant kidney dysfunction and we decided not to proceed with cardiac catheterization.  I have medication has been adjusted he is coming today to my office to follow-up.  Overall doing very well denies have any chest pain tightness squeezing pressure burning chest.  We talk about his medication and I will increase dose of Imdur from 30 mg to 60 mg daily.  Past Medical History:  Diagnosis Date  . Diabetes mellitus without complication ([place])   . Hyperlipidemia   . Hypertension     Past Surgical History:  Procedure Laterality Date  . CORONARY ANGIOPLASTY      Current Medications: Current Meds  Medication Sig  . albuterol (VENTOLIN HFA) 108 (90 Base) MCG/ACT inhaler Inhale 2 puffs into the lungs every 6 (six) hours as needed for wheezing or shortness of breath.  Marland Kitchen apixaban (ELIQUIS) 5 MG TABS tablet Take 1 tablet (5 mg total) 2 (two) times daily by mouth.  . Ascorbic Acid (VITAMIN C) POWD Take 1,000 mg by mouth daily. Mix in water and drink  . carvedilol (COREG) 25 MG tablet Take 1 tablet (25 mg total) by mouth 2 (two) times daily.  Marland Kitchen  CINNAMON PO Take 1,000 mg by mouth daily.  . fluticasone (FLONASE) 50 MCG/ACT nasal spray Place 1 spray into both nostrils daily.  . furosemide (LASIX) 20 MG tablet Take 2 tablets (40 mg total) by mouth daily.  Marland Kitchen gabapentin (NEURONTIN) 300 MG capsule Take 300 mg by mouth 3 (three) times daily as needed (severe back pain).   . hydrALAZINE (APRESOLINE) 50 MG tablet Take 1 tablet (50 mg total) by mouth every 8 (eight) hours.  . insulin aspart (NOVOLOG) 100 UNIT/ML FlexPen Inject 18-42 Units into the skin 3 (three) times daily with meals. Per sliding scale based on CBG  . insulin degludec (TRESIBA FLEXTOUCH) 100 UNIT/ML SOPN FlexTouch Pen Inject 0.5-0.6 mLs (50-60 Units total) into the skin See admin instructions. Please use only half about 20-30 units daily for few days while you have kidney failure. And increase slowly to home dose- call you MD  . isosorbide mononitrate (IMDUR) 60 MG 24 hr tablet Take 1 tablet (60 mg total) by mouth daily.  Marland Kitchen levothyroxine (SYNTHROID) 25 MCG tablet Take 25 mcg by mouth daily.  Marland Kitchen liraglutide (VICTOZA) 18 MG/3ML SOPN Inject 1.8 mg into the skin at bedtime.   Marland Kitchen loratadine (CLARITIN) 10 MG tablet Take 10 mg by mouth daily.  Marland Kitchen omeprazole (PRILOSEC) 20 MG capsule TAKE 1 CAPSULE  BY MOUTH EVERY DAY (Patient taking differently: Take 20 mg by mouth daily. )  . [DISCONTINUED] isosorbide mononitrate (IMDUR) 30 MG 24 hr tablet Take 1 tablet (30 mg total) by mouth daily.     Allergies:   Zetia [ezetimibe] and Lisinopril   Social History   Socioeconomic History  . Marital status: Married    Spouse name: Not on file  . Number of children: Not on file  . Years of education: Not on file  . Highest education level: Not on file  Occupational History  . Not on file  Social Needs  . Financial resource strain: Not very hard  . Food insecurity    Worry: Never true    Inability: Never true  . Transportation needs    Medical: No    Non-medical: No  Tobacco Use  . Smoking  status: Never Smoker  . Smokeless tobacco: Never Used  Substance and Sexual Activity  . Alcohol use: No  . Drug use: No  . Sexual activity: Not on file  Lifestyle  . Physical activity    Days per week: 7 days    Minutes per session: 60 min  . Stress: Not at all  Relationships  . Social connections    Talks on phone: More than three times a week    Gets together: Twice a week    Attends religious service: More than 4 times per year    Active member of club or organization: No    Attends meetings of clubs or organizations: Never    Relationship status: Married  Other Topics Concern  . Not on file  Social History Narrative  . Not on file     Family History: The patient's family history includes AAA (abdominal aortic aneurysm) in his father; Diabetes in his mother; Hyperlipidemia in his mother; Hypertension in his mother. ROS:   Please see the history of present illness.    All 14 point review of systems negative except as described per history of present illness  EKGs/Labs/Other Studies Reviewed:      Recent Labs: 07/05/2018: ALT 28; B Natriuretic Peptide 546.7; TSH 1.476 07/10/2018: Hemoglobin 10.7; Magnesium 1.9; Platelets 178 07/19/2018: BUN 37; Creatinine, Ser 2.24; Potassium 5.1; Sodium 137  Recent Lipid Panel    Component Value Date/Time   CHOL 120 07/03/2018 0813   TRIG 79 07/03/2018 0813   HDL 34 (L) 07/03/2018 0813   CHOLHDL 3.5 07/03/2018 0813   LDLCALC 70 07/03/2018 0813    Physical Exam:    VS:  BP 126/60   Pulse 90   Ht 5\' 11"  (1.803 m)   Wt 294 lb 3.2 oz (133.4 kg)   SpO2 96%   BMI 41.03 kg/m     Wt Readings from Last 3 Encounters:  08/16/18 294 lb 3.2 oz (133.4 kg)  07/19/18 292 lb 12.8 oz (132.8 kg)  07/09/18 (!) 304 lb 14.3 oz (138.3 kg)     GEN:  Well nourished, well developed in no acute distress HEENT: Normal NECK: No JVD; No carotid bruits LYMPHATICS: No lymphadenopathy CARDIAC: RRR, no murmurs, no rubs, no gallops RESPIRATORY:   Clear to auscultation without rales, wheezing or rhonchi  ABDOMEN: Soft, non-tender, non-distended MUSCULOSKELETAL:  No edema; No deformity  SKIN: Warm and dry LOWER EXTREMITIES: no swelling NEUROLOGIC:  Alert and oriented x 3 PSYCHIATRIC:  Normal affect   ASSESSMENT:    1. Chronic systolic congestive heart failure ([place])   2. Essential hypertension   3. Ischemic cardiomyopathy   4.  CKD (chronic kidney disease), stage IV (HCC)    PLAN:    In order of problems listed above:  1. Chronic systolic congestive heart failure appears to be compensated.  Off Aldactone of ACE inhibitor off ARB secondary to kidney dysfunction his creatinine is stable at the level of 2-3.  I will increase vasodilatation.  We will double the dose of Imdur. 2. Essential hypertension blood pressure well controlled. 3. Chronic kidney failure.  Continue monitoring.  His creatinine is between 2 and 3.   Medication Adjustments/Labs and Tests Ordered: Current medicines are reviewed at length with the patient today.  Concerns regarding medicines are outlined above.  No orders of the defined types were placed in this encounter.  Medication changes:  Meds ordered this encounter  Medications  . isosorbide mononitrate (IMDUR) 60 MG 24 hr tablet    Sig: Take 1 tablet (60 mg total) by mouth daily.    Dispense:  90 tablet    Refill:  0  . nitroGLYCERIN (NITROSTAT) 0.4 MG SL tablet    Sig: Place 1 tablet (0.4 mg total) under the tongue every 5 (five) minutes as needed for chest pain.    Dispense:  25 tablet    Refill:  11    Signed, Park Liter, MD, [facility] 08/16/2018 11:52 AM    [place]

## 2018-08-16 NOTE — Patient Instructions (Signed)
Medication Instructions:  Your physician has recommended you make the following change in your medication:   INCREASE: imdur to 60 mg daily  Take as needed for chest pain: Nitroglycerin 0.4 mg sublingual (under your tongue) as needed for chest pain. If experiencing chest pain, stop what you are doing and sit down. Take 1 nitroglycerin and wait 5 minutes. If chest pain continues, take another nitroglycerin and wait 5 minutes. If chest pain does not subside, take 1 more nitroglycerin and dial 911. You make take a total of 3 nitroglycerin in a 15 minute time frame.  If you need a refill on your cardiac medications before your next appointment, please call your pharmacy.   Lab work: None.  If you have labs (blood work) drawn today and your tests are completely normal, you will receive your results only by: Marland Kitchen MyChart Message (if you have MyChart) OR . A paper copy in the mail If you have any lab test that is abnormal or we need to change your treatment, we will call you to review the results.  Testing/Procedures: None.   Follow-Up: At [facility], you and your health needs are our priority.  As part of our continuing mission to provide you with exceptional heart care, we have created designated Provider Care Teams.  These Care Teams include your primary Cardiologist (physician) and Advanced Practice Providers (APPs -  Physician Assistants and Nurse Practitioners) who all work together to provide you with the care you need, when you need it. You will need a follow up appointment in 2 months.  Please call our office 2 months in advance to schedule this appointment.  You may see Jenne Campus, MD or another member of our [place] Provider Team in [place]: Shirlee More, MD . Jyl Heinz, MD  Any Other Special Instructions Will Be Listed Below (If Applicable).  Nitroglycerin sublingual tablets What is this medicine? NITROGLYCERIN (nye troe GLI ser in) is a type of vasodilator. It  relaxes blood vessels, increasing the blood and oxygen supply to your heart. This medicine is used to relieve chest pain caused by angina. It is also used to prevent chest pain before activities like climbing stairs, going outdoors in cold weather, or sexual activity. This medicine may be used for other purposes; ask your health care provider or pharmacist if you have questions. COMMON BRAND NAME(S): Nitroquick, Nitrostat, Nitrotab What should I tell my health care provider before I take this medicine? They need to know if you have any of these conditions:  anemia  head injury, recent stroke, or bleeding in the brain  liver disease  previous heart attack  an unusual or allergic reaction to nitroglycerin, other medicines, foods, dyes, or preservatives  pregnant or trying to get pregnant  breast-feeding How should I use this medicine? Take this medicine by mouth as needed. At the first sign of an angina attack (chest pain or tightness) place one tablet under your tongue. You can also take this medicine 5 to 10 minutes before an event likely to produce chest pain. Follow the directions on the prescription label. Let the tablet dissolve under the tongue. Do not swallow whole. Replace the dose if you accidentally swallow it. It will help if your mouth is not dry. Saliva around the tablet will help it to dissolve more quickly. Do not eat or drink, smoke or chew tobacco while a tablet is dissolving. If you are not better within 5 minutes after taking ONE dose of nitroglycerin, call 9-1-1 immediately to  seek emergency medical care. Do not take more than 3 nitroglycerin tablets over 15 minutes. If you take this medicine often to relieve symptoms of angina, your doctor or health care professional may provide you with different instructions to manage your symptoms. If symptoms do not go away after following these instructions, it is important to call 9-1-1 immediately. Do not take more than 3 nitroglycerin  tablets over 15 minutes. Talk to your pediatrician regarding the use of this medicine in children. Special care may be needed. Overdosage: If you think you have taken too much of this medicine contact a poison control center or emergency room at once. NOTE: This medicine is only for you. Do not share this medicine with others. What if I miss a dose? This does not apply. This medicine is only used as needed. What may interact with this medicine? Do not take this medicine with any of the following medications:  certain migraine medicines like ergotamine and dihydroergotamine (DHE)  medicines used to treat erectile dysfunction like sildenafil, tadalafil, and vardenafil  riociguat This medicine may also interact with the following medications:  alteplase  aspirin  heparin  medicines for high blood pressure  medicines for mental depression  other medicines used to treat angina  phenothiazines like chlorpromazine, mesoridazine, prochlorperazine, thioridazine This list may not describe all possible interactions. Give your health care provider a list of all the medicines, herbs, non-prescription drugs, or dietary supplements you use. Also tell them if you smoke, drink alcohol, or use illegal drugs. Some items may interact with your medicine. What should I watch for while using this medicine? Tell your doctor or health care professional if you feel your medicine is no longer working. Keep this medicine with you at all times. Sit or lie down when you take your medicine to prevent falling if you feel dizzy or faint after using it. Try to remain calm. This will help you to feel better faster. If you feel dizzy, take several deep breaths and lie down with your feet propped up, or bend forward with your head resting between your knees. You may get drowsy or dizzy. Do not drive, use machinery, or do anything that needs mental alertness until you know how this drug affects you. Do not stand or sit  up quickly, especially if you are an older patient. This reduces the risk of dizzy or fainting spells. Alcohol can make you more drowsy and dizzy. Avoid alcoholic drinks. Do not treat yourself for coughs, colds, or pain while you are taking this medicine without asking your doctor or health care professional for advice. Some ingredients may increase your blood pressure. What side effects may I notice from receiving this medicine? Side effects that you should report to your doctor or health care professional as soon as possible:  blurred vision  dry mouth  skin rash  sweating  the feeling of extreme pressure in the head  unusually weak or tired Side effects that usually do not require medical attention (report to your doctor or health care professional if they continue or are bothersome):  flushing of the face or neck  headache  irregular heartbeat, palpitations  nausea, vomiting This list may not describe all possible side effects. Call your doctor for medical advice about side effects. You may report side effects to FDA at 1-800-FDA-1088. Where should I keep my medicine? Keep out of the reach of children. Store at room temperature between 20 and 25 degrees C (68 and 77 degrees F). Store in  original container. Protect from light and moisture. Keep tightly closed. Throw away any unused medicine after the expiration date. NOTE: This sheet is a summary. It may not cover all possible information. If you have questions about this medicine, talk to your doctor, pharmacist, or health care provider.  2020 Elsevier/Gold Standard (2012-10-24 17:57:36)

## 2018-08-27 ENCOUNTER — Other Ambulatory Visit: Payer: Self-pay | Admitting: Cardiology

## 2018-08-30 ENCOUNTER — Other Ambulatory Visit: Payer: Self-pay | Admitting: Cardiology

## 2018-09-15 ENCOUNTER — Other Ambulatory Visit: Payer: Self-pay | Admitting: Cardiology

## 2018-10-27 ENCOUNTER — Other Ambulatory Visit: Payer: Self-pay | Admitting: Cardiology

## 2018-10-31 ENCOUNTER — Other Ambulatory Visit: Payer: Self-pay

## 2018-10-31 ENCOUNTER — Encounter: Payer: Self-pay | Admitting: Cardiology

## 2018-10-31 ENCOUNTER — Ambulatory Visit (INDEPENDENT_AMBULATORY_CARE_PROVIDER_SITE_OTHER): Payer: Medicare HMO | Admitting: Cardiology

## 2018-10-31 VITALS — BP 120/62 | HR 85 | Ht 71.0 in | Wt 302.6 lb

## 2018-10-31 DIAGNOSIS — I6990 Unspecified sequelae of unspecified cerebrovascular disease: Secondary | ICD-10-CM | POA: Diagnosis not present

## 2018-10-31 DIAGNOSIS — I255 Ischemic cardiomyopathy: Secondary | ICD-10-CM | POA: Diagnosis not present

## 2018-10-31 DIAGNOSIS — I5022 Chronic systolic (congestive) heart failure: Secondary | ICD-10-CM | POA: Diagnosis not present

## 2018-10-31 DIAGNOSIS — E1165 Type 2 diabetes mellitus with hyperglycemia: Secondary | ICD-10-CM

## 2018-10-31 DIAGNOSIS — IMO0002 Reserved for concepts with insufficient information to code with codable children: Secondary | ICD-10-CM

## 2018-10-31 DIAGNOSIS — E118 Type 2 diabetes mellitus with unspecified complications: Secondary | ICD-10-CM

## 2018-10-31 MED ORDER — ISOSORBIDE MONONITRATE ER 60 MG PO TB24: 60.0000 mg | ORAL_TABLET | Freq: Every day | ORAL | 0 refills | Status: DC

## 2018-10-31 MED ORDER — OMEPRAZOLE 20 MG PO CPDR: DELAYED_RELEASE_CAPSULE | ORAL | 1 refills | Status: AC

## 2018-10-31 MED ORDER — HYDRALAZINE HCL 50 MG PO TABS: ORAL_TABLET | ORAL | 5 refills | Status: DC

## 2018-10-31 MED ORDER — FUROSEMIDE 20 MG PO TABS: 40.0000 mg | ORAL_TABLET | Freq: Every day | ORAL | 1 refills | Status: DC

## 2018-10-31 NOTE — Patient Instructions (Signed)
Medication Instructions:  Your physician recommends that you continue on your current medications as directed. Please refer to the Current Medication list given to you today.  *If you need a refill on your cardiac medications before your next appointment, please call your pharmacy*  Lab Work: Your physician recommends that you return for lab work today: BMP, PRO BNP   If you have labs (blood work) drawn today and your tests are completely normal, you will receive your results only by: Marland Kitchen MyChart Message (if you have MyChart) OR . A paper copy in the mail If you have any lab test that is abnormal or we need to change your treatment, we will call you to review the results.  Testing/Procedures: None.   Follow-Up: At [facility], you and your health needs are our priority.  As part of our continuing mission to provide you with exceptional heart care, we have created designated Provider Care Teams.  These Care Teams include your primary Cardiologist (physician) and Advanced Practice Providers (APPs -  Physician Assistants and Nurse Practitioners) who all work together to provide you with the care you need, when you need it.  Your next appointment:  1 month   The format for your next appointment:   In Person  Provider:   Jenne Campus, MD  Other Instructions

## 2018-10-31 NOTE — Progress Notes (Signed)
Cardiology Office Note:    Date:  10/31/2018   ID:  Adam Carney, DOB 1950-06-23, MRN [id number]  PCP:  Martinique, Sarah T, MD  Cardiologist:  Adam Campus, MD    Referring MD: Martinique, Sarah T, MD   Chief Complaint  Patient presents with  . Follow-up  Doing well  History of Present Illness:    Adam Carney is a 68 y.o. male with complex past medical history which include coronary artery disease status post multiple MIs, cardiomyopathy, kidney dysfunction comes today to my office for follow-up.  Overall seems to be doing well I went outside and spoke to his wife who was sitting in the car waiting for him she tells me that he is doing well he is willing to participate in some activities he already folded he goes to chicken house with her helper.  Denies having any cardiac complaints.  Recently visited endocrinologist and he got good report his hemoglobin A1c went down to 7.  That is a big achievement.  Past Medical History:  Diagnosis Date  . Diabetes mellitus without complication ([place])   . Hyperlipidemia   . Hypertension     Past Surgical History:  Procedure Laterality Date  . CORONARY ANGIOPLASTY      Current Medications: Current Meds  Medication Sig  . albuterol (VENTOLIN HFA) 108 (90 Base) MCG/ACT inhaler Inhale 2 puffs into the lungs every 6 (six) hours as needed for wheezing or shortness of breath.  Marland Kitchen apixaban (ELIQUIS) 5 MG TABS tablet Take 1 tablet (5 mg total) 2 (two) times daily by mouth.  . Ascorbic Acid (VITAMIN C) POWD Take 1,000 mg by mouth daily. Mix in water and drink  . carvedilol (COREG) 25 MG tablet Take 1 tablet (25 mg total) by mouth 2 (two) times daily.  Marland Kitchen CINNAMON PO Take 1,000 mg by mouth daily.  . fluticasone (FLONASE) 50 MCG/ACT nasal spray Place 1 spray into both nostrils daily.  . furosemide (LASIX) 20 MG tablet Take 2 tablets (40 mg total) by mouth daily.  Marland Kitchen gabapentin (NEURONTIN) 300 MG capsule Take 300 mg by mouth 3 (three) times daily as needed  (severe back pain).   . hydrALAZINE (APRESOLINE) 50 MG tablet TAKE 1 TABLET BY MOUTH EVERY 8 (EIGHT) HOURS.  Marland Kitchen insulin aspart (NOVOLOG) 100 UNIT/ML FlexPen Inject 18-42 Units into the skin 3 (three) times daily with meals. Per sliding scale based on CBG  . insulin degludec (TRESIBA FLEXTOUCH) 100 UNIT/ML SOPN FlexTouch Pen Inject 0.5-0.6 mLs (50-60 Units total) into the skin See admin instructions. Please use only half about 20-30 units daily for few days while you have kidney failure. And increase slowly to home dose- call you MD  . levothyroxine (SYNTHROID) 25 MCG tablet Take 25 mcg by mouth daily.  Marland Kitchen liraglutide (VICTOZA) 18 MG/3ML SOPN Inject 1.8 mg into the skin at bedtime.   Marland Kitchen loratadine (CLARITIN) 10 MG tablet Take 10 mg by mouth daily.  . nitroGLYCERIN (NITROSTAT) 0.4 MG SL tablet Place 1 tablet (0.4 mg total) under the tongue every 5 (five) minutes as needed for chest pain.  Marland Kitchen omeprazole (PRILOSEC) 20 MG capsule TAKE 1 CAPSULE BY MOUTH EVERY DAY     Allergies:   Zetia [ezetimibe] and Lisinopril   Social History   Socioeconomic History  . Marital status: Married    Spouse name: Not on file  . Number of children: Not on file  . Years of education: Not on file  . Highest education level: Not on file  Occupational History  . Not on file  Social Needs  . Financial resource strain: Not very hard  . Food insecurity    Worry: Never true    Inability: Never true  . Transportation needs    Medical: No    Non-medical: No  Tobacco Use  . Smoking status: Never Smoker  . Smokeless tobacco: Never Used  Substance and Sexual Activity  . Alcohol use: No  . Drug use: No  . Sexual activity: Not on file  Lifestyle  . Physical activity    Days per week: 7 days    Minutes per session: 60 min  . Stress: Not at all  Relationships  . Social connections    Talks on phone: More than three times a week    Gets together: Twice a week    Attends religious service: More than 4 times per year     Active member of club or organization: No    Attends meetings of clubs or organizations: Never    Relationship status: Married  Other Topics Concern  . Not on file  Social History Narrative  . Not on file     Family History: The patient's family history includes AAA (abdominal aortic aneurysm) in his father; Diabetes in his mother; Hyperlipidemia in his mother; Hypertension in his mother. ROS:   Please see the history of present illness.    All 14 point review of systems negative except as described per history of present illness  EKGs/Labs/Other Studies Reviewed:      Recent Labs: 07/05/2018: ALT 28; B Natriuretic Peptide 546.7; TSH 1.476 07/10/2018: Hemoglobin 10.7; Magnesium 1.9; Platelets 178 07/19/2018: BUN 37; Creatinine, Ser 2.24; Potassium 5.1; Sodium 137  Recent Lipid Panel    Component Value Date/Time   CHOL 120 07/03/2018 0813   TRIG 79 07/03/2018 0813   HDL 34 (L) 07/03/2018 0813   CHOLHDL 3.5 07/03/2018 0813   LDLCALC 70 07/03/2018 0813    Physical Exam:    VS:  BP 120/62   Pulse 85   Ht 5\' 11"  (1.803 m)   Wt (!) 302 lb 9.6 oz (137.3 kg)   SpO2 95%   BMI 42.20 kg/m     Wt Readings from Last 3 Encounters:  10/31/18 (!) 302 lb 9.6 oz (137.3 kg)  08/16/18 294 lb 3.2 oz (133.4 kg)  07/19/18 292 lb 12.8 oz (132.8 kg)     GEN:  Well nourished, well developed in no acute distress HEENT: Normal NECK: No JVD; No carotid bruits LYMPHATICS: No lymphadenopathy CARDIAC: RRR, no murmurs, no rubs, no gallops RESPIRATORY:  Clear to auscultation without rales, wheezing or rhonchi  ABDOMEN: Soft, non-tender, non-distended MUSCULOSKELETAL:  No edema; No deformity  SKIN: Warm and dry LOWER EXTREMITIES: no swelling NEUROLOGIC:  Alert and oriented x 3 PSYCHIATRIC:  Normal affect   ASSESSMENT:    1. Ischemic cardiomyopathy   2. Chronic systolic congestive heart failure ([place])   3. Late effects of CVA (cerebrovascular accident)   4. Diabetes mellitus type 2,  uncontrolled, with complications ([place])    PLAN:    In order of problems listed above:  1. Ischemic cardiomyopathy on appropriate medication that he is able to tolerate the biggest limiting factor is his kidney function which I will check today I will also check his proBNP today. 2. Congestive heart failure compensated we will continue present management 3. Late effect of CVA doing well 4. Type 2 diabetes improving.   Medication Adjustments/Labs and Tests Ordered: Current medicines are  reviewed at length with the patient today.  Concerns regarding medicines are outlined above.  No orders of the defined types were placed in this encounter.  Medication changes: No orders of the defined types were placed in this encounter.   Signed, Park Liter, MD, [facility] 10/31/2018 11:39 AM    [place]

## 2018-11-01 LAB — BASIC METABOLIC PANEL
BUN/Creatinine Ratio: 14 (ref 10–24)
BUN: 31 mg/dL — ABNORMAL HIGH (ref 8–27)
CO2: 23 mmol/L (ref 20–29)
Calcium: 8.8 mg/dL (ref 8.6–10.2)
Chloride: 103 mmol/L (ref 96–106)
Creatinine, Ser: 2.19 mg/dL — ABNORMAL HIGH (ref 0.76–1.27)
GFR calc Af Amer: 34 mL/min/{1.73_m2} — ABNORMAL LOW (ref >59)
GFR calc non Af Amer: 30 mL/min/{1.73_m2} — ABNORMAL LOW (ref >59)
Glucose: 168 mg/dL — ABNORMAL HIGH (ref 65–99)
Potassium: 4.7 mmol/L (ref 3.5–5.2)
Sodium: 139 mmol/L (ref 134–144)

## 2018-11-01 LAB — PRO B NATRIURETIC PEPTIDE: NT-Pro BNP: 1167 pg/mL — ABNORMAL HIGH (ref 0–376)

## 2018-11-11 ENCOUNTER — Telehealth: Payer: Self-pay | Admitting: Emergency Medicine

## 2018-11-11 NOTE — Telephone Encounter (Signed)
Called patient informed him his eliquis patient assistance with end December 2020 and he will need to re-apply for assistance. I am faxing application to [place] office and he plans to pick up.

## 2018-12-24 ENCOUNTER — Other Ambulatory Visit: Payer: Self-pay | Admitting: Cardiology

## 2018-12-24 MED ORDER — APIXABAN 5 MG PO TABS: 5.0000 mg | ORAL_TABLET | Freq: Two times a day (BID) | ORAL | 3 refills | Status: AC

## 2018-12-28 ENCOUNTER — Other Ambulatory Visit: Payer: Self-pay | Admitting: Cardiology

## 2019-01-23 ENCOUNTER — Encounter: Payer: Self-pay | Admitting: Cardiology

## 2019-01-23 ENCOUNTER — Ambulatory Visit (INDEPENDENT_AMBULATORY_CARE_PROVIDER_SITE_OTHER): Payer: Medicare HMO | Admitting: Cardiology

## 2019-01-23 ENCOUNTER — Other Ambulatory Visit: Payer: Self-pay

## 2019-01-23 VITALS — BP 134/70 | HR 87 | Ht 71.0 in | Wt 302.0 lb

## 2019-01-23 DIAGNOSIS — I255 Ischemic cardiomyopathy: Secondary | ICD-10-CM | POA: Diagnosis not present

## 2019-01-23 DIAGNOSIS — I10 Essential (primary) hypertension: Secondary | ICD-10-CM

## 2019-01-23 DIAGNOSIS — E118 Type 2 diabetes mellitus with unspecified complications: Secondary | ICD-10-CM

## 2019-01-23 NOTE — Progress Notes (Signed)
Cardiology Office Note:    Date:  01/23/2019   ID:  Adam Carney, DOB [birth year], MRN [id number]  PCP:  Martinique, Sarah T, MD  Cardiologist:  Jenne Campus, MD    Referring MD: Martinique, Sarah T, MD   Chief Complaint  Patient presents with  . Follow-up    3 Months  Doing well  History of Present Illness:    Adam Carney is a 69 y.o. male with complex past medical history which include coronary artery disease multiple myocardial infarction, ischemic cardiomyopathy with latest ejection fraction of 45% based on echocardiogram done in the summer 2020.  He also got diabetes somewhat difficult to control but lately getting better.  Comes today to my office for follow-up he said that he had good days and bad days his wife is with him as well and she conquer.  He denies having a chest pain tightness squeezing pressure burning chest there is some shortness of breath but overall seems to be coping with the situation quite well.  Past Medical History:  Diagnosis Date  . Diabetes mellitus without complication ([place])   . Hyperlipidemia   . Hypertension     Past Surgical History:  Procedure Laterality Date  . CORONARY ANGIOPLASTY      Current Medications: Current Meds  Medication Sig  . apixaban (ELIQUIS) 5 MG TABS tablet Take 1 tablet (5 mg total) by mouth 2 (two) times daily.  . Ascorbic Acid (VITAMIN C) POWD Take 1,000 mg by mouth daily. Mix in water and drink  . carvedilol (COREG) 25 MG tablet TAKE 1 TABLET BY MOUTH TWICE A DAY  . CINNAMON PO Take 1,000 mg by mouth daily.  . furosemide (LASIX) 20 MG tablet Take 2 tablets (40 mg total) by mouth daily.  Marland Kitchen gabapentin (NEURONTIN) 300 MG capsule Take 300 mg by mouth 3 (three) times daily as needed (severe back pain).   . hydrALAZINE (APRESOLINE) 50 MG tablet TAKE 1 TABLET BY MOUTH EVERY 8 (EIGHT) HOURS.  Marland Kitchen insulin aspart (NOVOLOG) 100 UNIT/ML FlexPen Inject 18-42 Units into the skin 3 (three) times daily with meals. Per sliding scale based on  CBG  . insulin degludec (TRESIBA FLEXTOUCH) 100 UNIT/ML SOPN FlexTouch Pen Inject 0.5-0.6 mLs (50-60 Units total) into the skin See admin instructions. Please use only half about 20-30 units daily for few days while you have kidney failure. And increase slowly to home dose- call you MD  . isosorbide mononitrate (IMDUR) 60 MG 24 hr tablet Take 1 tablet (60 mg total) by mouth daily.  Marland Kitchen levothyroxine (SYNTHROID) 25 MCG tablet Take 25 mcg by mouth daily.  Marland Kitchen liraglutide (VICTOZA) 18 MG/3ML SOPN Inject 1.8 mg into the skin at bedtime.   Marland Kitchen loratadine (CLARITIN) 10 MG tablet Take 10 mg by mouth daily.  Marland Kitchen omeprazole (PRILOSEC) 20 MG capsule TAKE 1 CAPSULE BY MOUTH EVERY DAY     Allergies:   Zetia [ezetimibe] and Lisinopril   Social History   Socioeconomic History  . Marital status: Married    Spouse name: Not on file  . Number of children: Not on file  . Years of education: Not on file  . Highest education level: Not on file  Occupational History  . Not on file  Tobacco Use  . Smoking status: Never Smoker  . Smokeless tobacco: Never Used  Substance and Sexual Activity  . Alcohol use: No  . Drug use: No  . Sexual activity: Not on file  Other Topics Concern  . Not on file  Social History Narrative  . Not on file   Social Determinants of Health   Financial Resource Strain: Low Risk   . Difficulty of Paying Living Expenses: Not very hard  Food Insecurity: No Food Insecurity  . Worried About Charity fundraiser in the Last Year: Never true  . Ran Out of Food in the Last Year: Never true  Transportation Needs: No Transportation Needs  . Lack of Transportation (Medical): No  . Lack of Transportation (Non-Medical): No  Physical Activity: Sufficiently Active  . Days of Exercise per Week: 7 days  . Minutes of Exercise per Session: 60 min  Stress: No Stress Concern Present  . Feeling of Stress : Not at all  Social Connections: Slightly Isolated  . Frequency of Communication with Friends  and Family: More than three times a week  . Frequency of Social Gatherings with Friends and Family: Twice a week  . Attends Religious Services: More than 4 times per year  . Active Member of Clubs or Organizations: No  . Attends Archivist Meetings: Never  . Marital Status: Married     Family History: The patient's family history includes AAA (abdominal aortic aneurysm) in his father; Diabetes in his mother; Hyperlipidemia in his mother; Hypertension in his mother. ROS:   Please see the history of present illness.    All 14 point review of systems negative except as described per history of present illness  EKGs/Labs/Other Studies Reviewed:      Recent Labs: 07/05/2018: ALT 28; B Natriuretic Peptide 546.7; TSH 1.476 07/10/2018: Hemoglobin 10.7; Magnesium 1.9; Platelets 178 10/31/2018: BUN 31; Creatinine, Ser 2.19; NT-Pro BNP 1,167; Potassium 4.7; Sodium 139  Recent Lipid Panel    Component Value Date/Time   CHOL 120 07/03/2018 0813   TRIG 79 07/03/2018 0813   HDL 34 (L) 07/03/2018 0813   CHOLHDL 3.5 07/03/2018 0813   LDLCALC 70 07/03/2018 0813    Physical Exam:    VS:  BP 134/70   Pulse 87   Ht 5\' 11"  (1.803 m)   Wt (!) 302 lb (137 kg)   SpO2 92%   BMI 42.12 kg/m     Wt Readings from Last 3 Encounters:  01/23/19 (!) 302 lb (137 kg)  10/31/18 (!) 302 lb 9.6 oz (137.3 kg)  08/16/18 294 lb 3.2 oz (133.4 kg)     GEN:  Well nourished, well developed in no acute distress HEENT: Normal NECK: No JVD; No carotid bruits LYMPHATICS: No lymphadenopathy CARDIAC: RRR, no murmurs, no rubs, no gallops RESPIRATORY:  Clear to auscultation without rales, wheezing or rhonchi  ABDOMEN: Soft, non-tender, non-distended MUSCULOSKELETAL:  No edema; No deformity  SKIN: Warm and dry LOWER EXTREMITIES: no swelling NEUROLOGIC:  Alert and oriented x 3 PSYCHIATRIC:  Normal affect   ASSESSMENT:    1. Ischemic cardiomyopathy   2. Essential hypertension   3. Type 2 diabetes  mellitus with complication (HCC)    PLAN:    In order of problems listed above:  1. Coronary disease no recent events.  On appropriate medication which we will continue. 2. History of CVA.  On anticoagulation which I will continue. 3. Cardiomyopathy with ejection fraction 45% hemodynamically compensated on appropriate medications which include Coreg as well as Apresoline.  He is not on ACE inhibitor because of significant kidney dysfunction.  Overall Stony is doing well.  Continue present management see him back in about 5 months   Medication Adjustments/Labs and Tests Ordered: Current medicines are reviewed at length  with the patient today.  Concerns regarding medicines are outlined above.  No orders of the defined types were placed in this encounter.  Medication changes: No orders of the defined types were placed in this encounter.   Signed, Park Liter, MD, [facility] 01/23/2019 9:07 AM    [place] [place]

## 2019-01-23 NOTE — Patient Instructions (Signed)

## 2019-01-26 ENCOUNTER — Other Ambulatory Visit: Payer: Self-pay | Admitting: Cardiology

## 2019-02-05 ENCOUNTER — Other Ambulatory Visit: Payer: Self-pay | Admitting: Cardiology

## 2019-03-31 ENCOUNTER — Other Ambulatory Visit: Payer: Self-pay | Admitting: Cardiology

## 2019-05-02 ENCOUNTER — Other Ambulatory Visit: Payer: Self-pay | Admitting: Cardiology

## 2019-07-09 ENCOUNTER — Other Ambulatory Visit: Payer: Self-pay | Admitting: Cardiology

## 2019-07-29 ENCOUNTER — Other Ambulatory Visit: Payer: Self-pay | Admitting: *Deleted

## 2019-07-29 ENCOUNTER — Other Ambulatory Visit: Payer: Self-pay | Admitting: Cardiology

## 2019-08-17 DIAGNOSIS — I255 Ischemic cardiomyopathy: Secondary | ICD-10-CM | POA: Diagnosis not present

## 2019-08-17 DIAGNOSIS — N189 Chronic kidney disease, unspecified: Secondary | ICD-10-CM | POA: Diagnosis not present

## 2019-08-17 DIAGNOSIS — I2510 Atherosclerotic heart disease of native coronary artery without angina pectoris: Secondary | ICD-10-CM | POA: Diagnosis not present

## 2019-08-17 DIAGNOSIS — U071 COVID-19: Secondary | ICD-10-CM

## 2019-08-17 DIAGNOSIS — R778 Other specified abnormalities of plasma proteins: Secondary | ICD-10-CM | POA: Diagnosis not present

## 2019-08-18 ENCOUNTER — Ambulatory Visit (HOSPITAL_COMMUNITY): Payer: Medicare HMO

## 2019-08-19 DIAGNOSIS — I2510 Atherosclerotic heart disease of native coronary artery without angina pectoris: Secondary | ICD-10-CM | POA: Diagnosis not present

## 2019-08-19 DIAGNOSIS — I517 Cardiomegaly: Secondary | ICD-10-CM | POA: Diagnosis not present

## 2019-08-19 DIAGNOSIS — N189 Chronic kidney disease, unspecified: Secondary | ICD-10-CM | POA: Diagnosis not present

## 2019-08-19 DIAGNOSIS — R778 Other specified abnormalities of plasma proteins: Secondary | ICD-10-CM | POA: Diagnosis not present

## 2019-08-19 DIAGNOSIS — I255 Ischemic cardiomyopathy: Secondary | ICD-10-CM | POA: Diagnosis not present

## 2019-08-20 DIAGNOSIS — I255 Ischemic cardiomyopathy: Secondary | ICD-10-CM | POA: Diagnosis not present

## 2019-08-20 DIAGNOSIS — I2510 Atherosclerotic heart disease of native coronary artery without angina pectoris: Secondary | ICD-10-CM | POA: Diagnosis not present

## 2019-08-20 DIAGNOSIS — R778 Other specified abnormalities of plasma proteins: Secondary | ICD-10-CM | POA: Diagnosis not present

## 2019-08-20 DIAGNOSIS — N189 Chronic kidney disease, unspecified: Secondary | ICD-10-CM | POA: Diagnosis not present

## 2019-08-21 ENCOUNTER — Inpatient Hospital Stay
Admission: AD | Admit: 2019-08-21 | Payer: Medicare HMO | Source: Other Acute Inpatient Hospital | Admitting: Internal Medicine

## 2019-08-21 DIAGNOSIS — N189 Chronic kidney disease, unspecified: Secondary | ICD-10-CM | POA: Diagnosis not present

## 2019-08-21 DIAGNOSIS — R778 Other specified abnormalities of plasma proteins: Secondary | ICD-10-CM | POA: Diagnosis not present

## 2019-08-21 DIAGNOSIS — I255 Ischemic cardiomyopathy: Secondary | ICD-10-CM | POA: Diagnosis not present

## 2019-08-21 DIAGNOSIS — I2510 Atherosclerotic heart disease of native coronary artery without angina pectoris: Secondary | ICD-10-CM | POA: Diagnosis not present

## 2019-09-10 DEATH — deceased

## 2019-11-10 IMAGING — DX PORTABLE CHEST - 1 VIEW
1 series · 2 of 2 positions shown · non-contrast
Comparison: July 05, 2018

CLINICAL DATA: Congestive heart failure.

EXAM:
PORTABLE CHEST 1 VIEW

[Series 1: chest ap · 0.14mm/px · 2 of 2 slices shown]
[im 1/2]
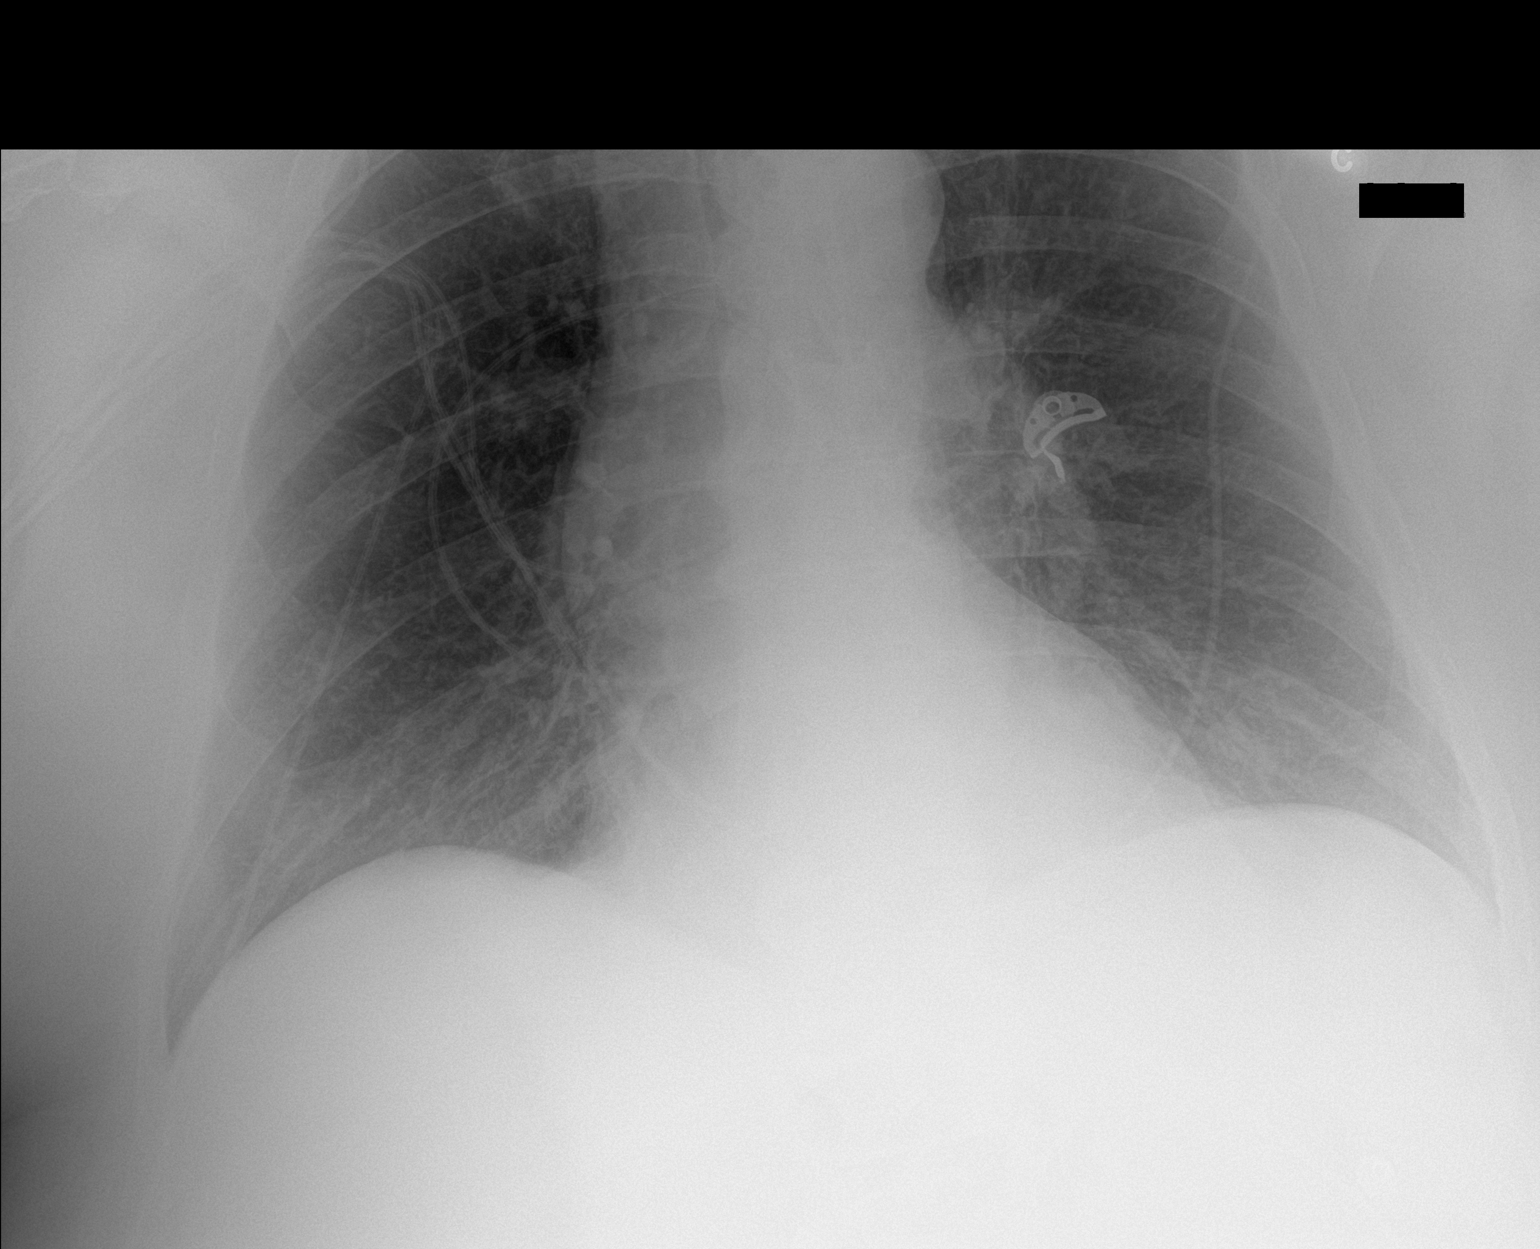
[im 2/2]
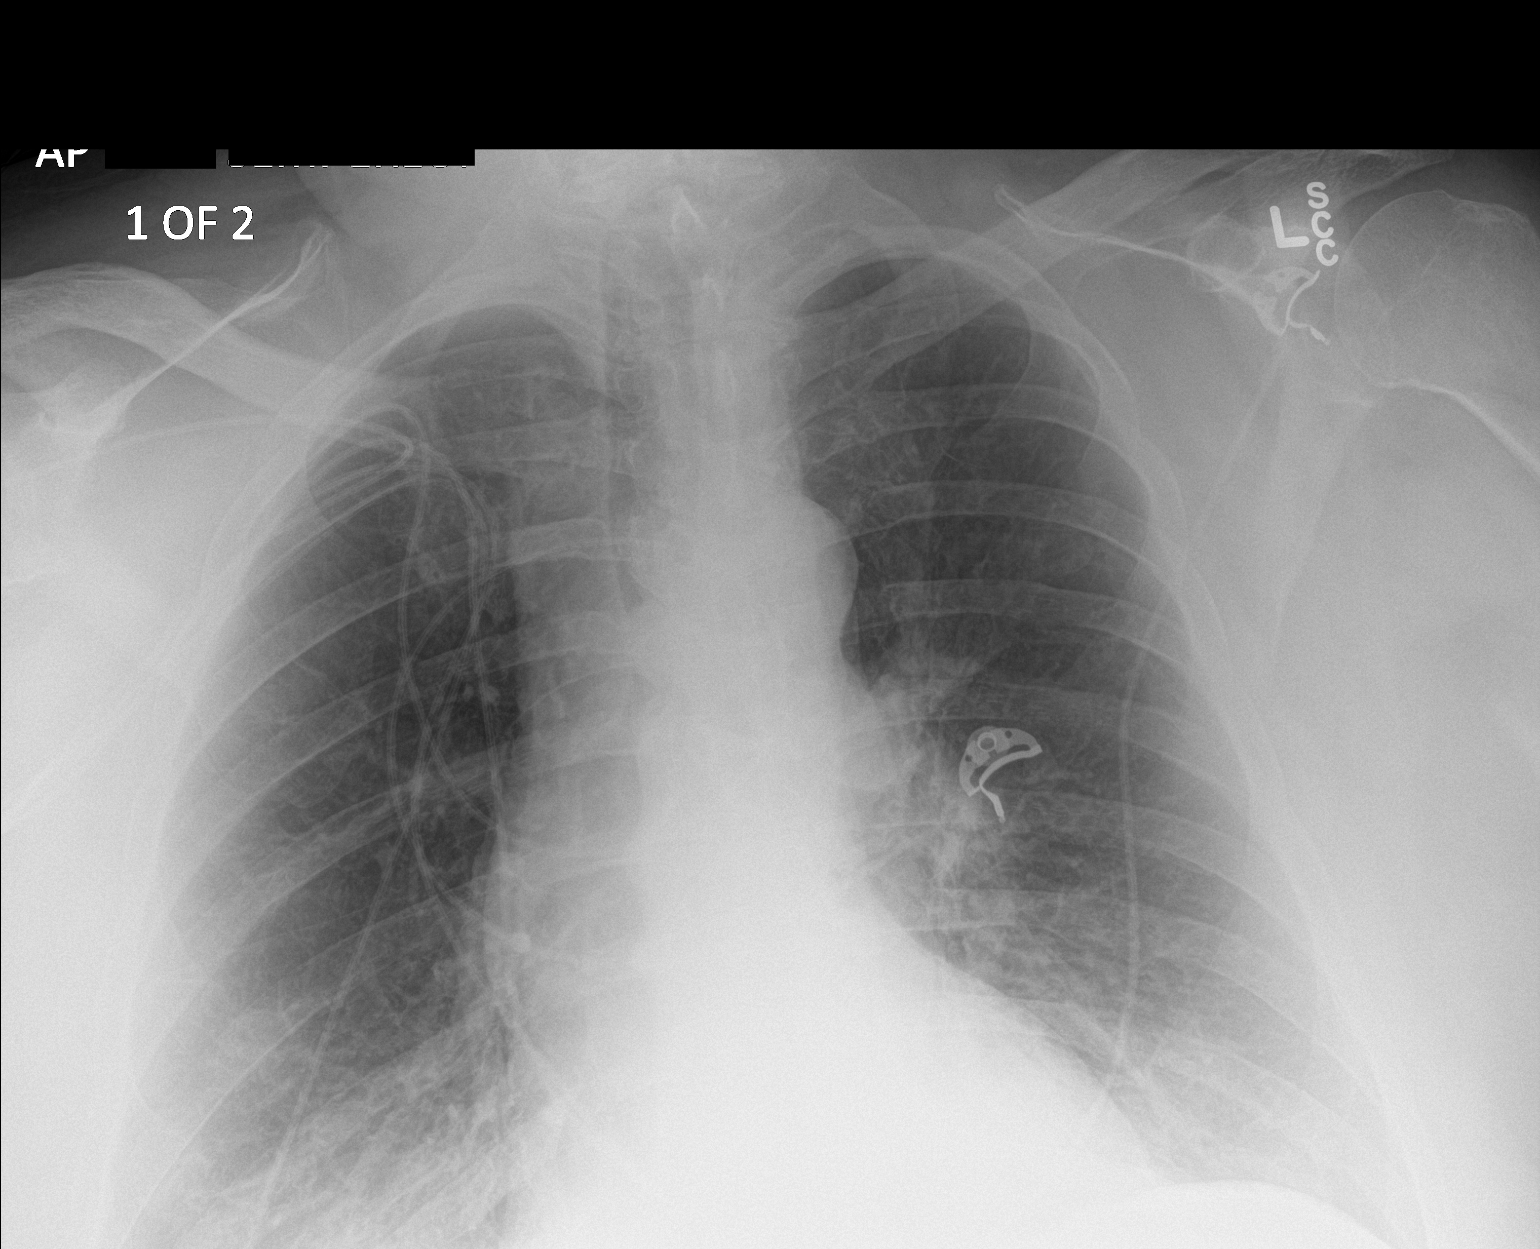

[2 of 2 positions shown; findings below may reference images not displayed]

FINDINGS: Bibasilar airspace opacities are again noted. The heart size is
stable from prior study. There is no pneumothorax. No large pleural
effusion. There are small bilateral pleural effusions. Mild aortic
calcifications are noted. There is no acute osseous abnormality.
IMPRESSION: Again noted are findings consistent with pulmonary edema as
previously described. No significant oval change from x-ray earlier
today.

## 2023-01-02 ENCOUNTER — Other Ambulatory Visit: Payer: Self-pay
